# Patient Record
Sex: Female | Born: 1960 | Race: White | Hispanic: No | Marital: Single
Health system: Midwestern US, Community
[De-identification: ages and names within clinical notes are randomized; demographics above are authoritative.]

## PROBLEM LIST (undated history)

## (undated) DIAGNOSIS — I10 Essential (primary) hypertension: Secondary | ICD-10-CM

---

## 2015-07-05 ENCOUNTER — Emergency Department: Admit: 2015-07-05 | Payer: PRIVATE HEALTH INSURANCE

## 2015-07-05 ENCOUNTER — Inpatient Hospital Stay
Admit: 2015-07-05 | Discharge: 2015-07-05 | Disposition: A | Discharge status: Home / Self Care | Payer: PRIVATE HEALTH INSURANCE

## 2015-07-05 DIAGNOSIS — J069 Acute upper respiratory infection, unspecified: Secondary | ICD-10-CM

## 2015-07-05 LAB — BASIC METABOLIC PANEL
Anion Gap: 9 mmol/L (ref 3–16)
BUN: 8 mg/dL (ref 7–25)
CO2: 24 mmol/L (ref 21–33)
Calcium: 9 mg/dL (ref 8.6–10.3)
Chloride: 106 mmol/L (ref 98–110)
Creatinine: 0.68 mg/dL (ref 0.60–1.30)
Glucose: 93 mg/dL (ref 70–100)
Osmolality, Calculated: 286 mOsm/kg (ref 278–305)
Potassium: 3.8 mmol/L (ref 3.5–5.3)
Sodium: 139 mmol/L (ref 133–146)
eGFR AA CKD-EPI: >90 See note.
eGFR NONAA CKD-EPI: >90 See note.

## 2015-07-05 LAB — DIFFERENTIAL
Basophils Absolute: 34 /uL (ref 0–200)
Basophils Relative: 0.7 % (ref 0.0–1.0)
Eosinophils Absolute: 115 /uL (ref 15–500)
Eosinophils Relative: 2.4 % (ref 0.0–8.0)
Lymphocytes Absolute: 581 /uL (ref 850–3900)
Lymphocytes Relative: 12.1 % (ref 15.0–45.0)
Monocytes Absolute: 461 /uL (ref 200–950)
Monocytes Relative: 9.6 % (ref 0.0–12.0)
Neutrophils Absolute: 3610 /uL (ref 1500–7800)
Neutrophils Relative: 75.2 % (ref 40.0–80.0)
nRBC: 0 /100 WBC (ref 0–0)

## 2015-07-05 LAB — CBC
Hematocrit: 40.4 % (ref 35.0–45.0)
Hemoglobin: 13.5 g/dL (ref 11.7–15.5)
MCH: 31.3 pg (ref 27.0–33.0)
MCHC: 33.5 g/dL (ref 32.0–36.0)
MCV: 93.5 fL (ref 80.0–100.0)
MPV: 9.1 fL (ref 7.5–11.5)
Platelets: 156 10*3/uL (ref 140–400)
RBC: 4.32 10*6/uL (ref 3.80–5.10)
RDW: 12.9 % (ref 11.0–15.0)
WBC: 4.8 10*3/uL (ref 3.8–10.8)

## 2015-07-05 LAB — TROPONIN I: Troponin I: <0.04 ng/mL (ref 0.00–0.03)

## 2015-07-05 LAB — B NATRIURETIC PEPTIDE: BNP: 31 pg/mL (ref 0–100)

## 2015-07-05 MED ORDER — lactatedRingers1000mLIVfluid
Freq: Once | INTRAVENOUS | Status: AC
Start: 2015-07-05 — End: 2015-07-05
  Administered 2015-07-05: 17:00:00 via INTRAVENOUS

## 2015-07-05 NOTE — Unmapped (Signed)
Upper Respiratory Infection, Adult  Most upper respiratory infections (URIs) are a viral infection of the air passages leading to the lungs. A URI affects the nose, throat, and upper air passages. The most common type of URI is nasopharyngitis and is typically referred to as the common cold.  URIs run their course and usually go away on their own. Most of the time, a URI does not require medical attention, but sometimes a bacterial infection in the upper airways can follow a viral infection. This is called a secondary infection. Sinus and middle ear infections are common types of secondary upper respiratory infections.  Bacterial pneumonia can also complicate a URI. A URI can worsen asthma and chronic obstructive pulmonary disease (COPD). Sometimes, these complications can require emergency medical care and may be life threatening.   CAUSES  Almost all URIs are caused by viruses. A virus is a type of germ and can spread from one person to another.   RISKS FACTORS  You may be at risk for a URI if:   ?? You smoke. ??  ?? You have chronic heart or lung disease.  ?? You have a weakened defense (immune) system. ??  ?? You are very young or very old. ??  ?? You have nasal allergies or asthma.  ?? You work in crowded or poorly ventilated areas.  ?? You work in health care facilities or schools.  SIGNS AND SYMPTOMS   Symptoms typically develop 2-3 days after you come in contact with a cold virus. Most viral URIs last 7-10 days. However, viral URIs from the influenza virus (flu virus) can last 14-18 days and are typically more severe. Symptoms may include:   ?? Runny or stuffy (congested) nose. ??  ?? Sneezing. ??  ?? Cough. ??  ?? Sore throat. ??  ?? Headache. ??  ?? Fatigue. ??  ?? Fever. ??  ?? Loss of appetite. ??  ?? Pain in your forehead, behind your eyes, and over your cheekbones (sinus pain).  ?? Muscle aches. ??  DIAGNOSIS   Your health care provider may diagnose a URI by:  ?? Physical exam.  ?? Tests to check that your symptoms are not due to  another condition such as:  ?? Strep throat.  ?? Sinusitis.  ?? Pneumonia.  ?? Asthma.  TREATMENT   A URI goes away on its own with time. It cannot be cured with medicines, but medicines may be prescribed or recommended to relieve symptoms. Medicines may help:  ?? Reduce your fever.  ?? Reduce your cough.  ?? Relieve nasal congestion.  HOME CARE INSTRUCTIONS   ?? Take medicines only as directed by your health care provider. ??  ?? Gargle warm saltwater or take cough drops to comfort your throat as directed by your health care provider.  ?? Use a warm mist humidifier or inhale steam from a shower to increase air moisture. This may make it easier to breathe.  ?? Drink enough fluid to keep your urine clear or pale yellow. ??  ?? Eat soups and other clear broths and maintain good nutrition. ??  ?? Rest as needed. ??  ?? Return to work when your temperature has returned to normal or as your health care provider advises. You may need to stay home longer to avoid infecting others. You can also use a face mask and careful hand washing to prevent spread of the virus.  ?? Increase the usage of your inhaler if you have asthma. ??  ?? Do not   use any tobacco products, including cigarettes, chewing tobacco, or electronic cigarettes. If you need help quitting, ask your health care provider.  PREVENTION   The best way to protect yourself from getting a cold is to practice good hygiene.   ?? Avoid oral or hand contact with people with cold symptoms. ??  ?? Wash your hands often if contact occurs. ??  There is no clear evidence that vitamin C, vitamin E, echinacea, or exercise reduces the chance of developing a cold. However, it is always recommended to get plenty of rest, exercise, and practice good nutrition.   SEEK MEDICAL CARE IF:   ?? You are getting worse rather than better. ??  ?? Your symptoms are not controlled by medicine. ??  ?? You have chills.  ?? You have worsening shortness of breath.  ?? You have brown or red mucus.  ?? You have yellow or brown nasal  discharge.  ?? You have pain in your face, especially when you bend forward.  ?? You have a fever.  ?? You have swollen neck glands.  ?? You have pain while swallowing.  ?? You have white areas in the back of your throat.  SEEK IMMEDIATE MEDICAL CARE IF:   ?? You have severe or persistent:  ?? Headache.  ?? Ear pain.  ?? Sinus pain.  ?? Chest pain.  ?? You have chronic lung disease and any of the following:  ?? Wheezing.  ?? Prolonged cough.  ?? Coughing up blood.  ?? A change in your usual mucus.  ?? You have a stiff neck.  ?? You have changes in your:  ?? Vision.  ?? Hearing.  ?? Thinking.  ?? Mood.  MAKE SURE YOU:   ?? Understand these instructions.  ?? Will watch your condition.  ?? Will get help right away if you are not doing well or get worse.     This information is not intended to replace advice given to you by your health care provider. Make sure you discuss any questions you have with your health care provider.     Document Released: 07/19/2000 Document Revised: 06/09/2014 Document Reviewed: 04/30/2013  Elsevier Interactive Patient Education ??2016 [employer]

## 2015-07-05 NOTE — Unmapped (Signed)
Pt reports that she has been traveling on a [employer] bus for 3 days and decided to get off the bus and come to the ED d/t not feeling good. Pt states that she has been congested for a week and yesterday started with chest pain and mid back pain. Pt is alert and oriented x4.

## 2015-07-05 NOTE — Unmapped (Signed)
ED Attending Attestation Note    Date of service:  07/05/2015    This patient was seen by the resident physician.  I have seen and examined the patient, agree with the workup, evaluation, management and diagnosis. The care plan has been discussed and I concur.  I have reviewed the ECG and concur with the resident's interpretation.    My assessment reveals a 56 y.o. female presetns to the ED with cough, chills, myalgias, congestion and sore throat since yesterady.  Exam reveals clear lungs, no LE swelling, soft abodmne.

## 2015-07-05 NOTE — Unmapped (Signed)
Pt with multiple complaints, endorses cold and flu symptoms in the past week, then states at 0200 this am she started having CP and back pain.

## 2015-07-05 NOTE — Unmapped (Signed)
[facility]  Emergency Department Note    Date of Service: 07/05/2015    Reason for Visit: Dizziness; Chest Pain; and Nasal Congestion      Patient History     HPI:  55 y.o. female with presents with complaint of sore throat, rhinorrhea, chest pain, dizziness. Symptoms started yesterday. At that time, she developed itchy throat, runny nose, runny eyes. Early this morning she had 4 episodes of emesis, non-bloody, non-bilious. Endorses diaphoresis but no documented fever. Endorses mild chest pain, and a sharp pain in her back, between her shoulder blades. The pain in her back is 8.75/10, sharp, better when she lies down on her right side, worse when she walks fast. Endorses lightheadedness when she walks fast. Denies dysuria. Denies history of blood clots. Has been riding [employer] buses for 1.5 - 2 days. No recent surgery. Denies oral contraceptive. Denies smoking. Denies EtOH, illicit drugs.     Past medical history: No past medical history on file.    Past surgical history: No past surgical history on file.    Social history: [facility] Miller  has no tobacco, alcohol, and drug history on file.    Medications:   Patient's Medications    No medications on file       Allergies:   Allergies as of 07/05/2015   ??? (Not on File)       PMH: Nursing notes reviewed   PSH: Nursing notes reviewed   FH: Nursing notes reviewed   MEDS: Nursing notes and chart reviewed     Review of Systems     ROS:  A full 10-point review of systems was performed. Positive for rhinorrhea, sore throat. Negative for fever. Otherwise negative except as detailed in the above HPI.    Physical Exam     Filed Vitals:    07/05/15 1229   BP: 118/70   Pulse: 94   Temp: 99.7 ??F (37.6 ??C)   TempSrc: Oral   Resp: 16   SpO2: 96%       General:  55 y.o. female, appears ill but non-toxic    HEENT:  normocephalic, atraumatic; pupils equal, round and reactive; sclera anicteric; conjunctiva pink; moist mucous membranes; no oropharyngeal erythema or mucosal lesions      Neck:  supple; no meningismus     Pulmonary:   lungs clear to auscultation bilaterally with good air entry; dry cough     Cardiac:  regular rate and regular rhythm with no murmurs, rubs, or gallops     Abdomen:  soft; non-tender, non-distended; no rebound; no guarding; normal bowel sounds     Musculoskeletal:  atraumatic exam with no focal swelling or tenderness, no peripheral edema     Vascular:  2+ radial pulses     Skin:  warm and well perfused; no rashes or lesions     Neuro:  alert and oriented x 4; cranial nerves II - XII grossly intact; normal gait; strength grossly intact; sensation grossly intact     Psych:  appropriate mood and affect     Diagnostic Studies     Labs:  Please see the EMR for comprehensive lab results.   All labs have been reviewed.     Radiology:  Please see the EMR for comprehensive imaging results.   All imaging reviewed.     EKG:  Ventricular rate: 86 bpm  Rhythm: normal sinus   QRS Axis: +38   Intervals: PR 116 QRS 72 QTc 397   ST Segments: no acute change  T Waves: small inversion V1  Q Waves: none  Clinical Impression: normal sinus rhythm, small T wave inversion V1, no prior for comparison    Emergency Department Procedures     - no procedures performed during this encounter     ED Course and MDM     Gloria Miller is a 55 y.o. female with a history and presentation as described in the above HPI.  The patient was evaluated by myself and the ED Attending Physician, Dr. Rubye Oaks. All management and disposition plans were discussed and agreed upon.    This Is a 55 year old female who presents with one day of upper respiratory infection like symptoms.  On arrival, the patient is noted to be afebrile and otherwise unremarkable vital signs.  She appears ill but nontoxic.  She is actively coughing.  Her cardiac exam reveals no murmur.  Lungs are clear to auscultation bilaterally.  Abdominal exam is benign.  The patient's EKG shows one small T wave inversion in V1, but is otherwise  unremarkable.  Believe the patient's complaint of chest discomfort is less likely to represent ACS, as she has a reassuring EKG and a negative troponin.  I believe her chest symptoms are related to her coughing.  Similarly, I believe that the pain the patient is experiencing her back is related to coughing.  No midline tenderness to palpation.    Renal panel is generally unremarkable.  CBC is without leukocytosis.  Troponin is within normal limits, as is BNP.  Chest X-ray is negative.    The patient's presentation is most consistent with acute upper respiratory infection. Given pt is able to eat and drink without difficulty, has vital signs that are reassuring, as well as an exam and laboratory studies that are reassuring, I believe she is appropriate for management on an outpatient basis.    Risks, benefits, and alternatives were discussed. At this time the patient has been deemed safe for discharge. My customary discharge instructions including strict return precautions for worsening or new symptoms have been communicated.      Impression     1. Upper respiratory infection, acute         ---    Patriciaann Clan, MD PGY-2  [facility]    Patriciaann Clan, MD  Resident  07/05/15 [phone]

## 2016-02-07 ENCOUNTER — Emergency Department (HOSPITAL_COMMUNITY): Payer: Self-pay | Admitting: Emergency Medicine

## 2016-04-08 ENCOUNTER — Emergency Department
Admission: EM | Admit: 2016-04-08 | Discharge: 2016-04-08 | Disposition: A | Discharge status: Home / Self Care | Payer: Medicaid Other | Attending: Emergency Medicine | Admitting: Emergency Medicine

## 2016-04-08 ENCOUNTER — Emergency Department (HOSPITAL_COMMUNITY)

## 2016-04-08 ENCOUNTER — Emergency Department (HOSPITAL_COMMUNITY): Payer: Medicaid Other | Admitting: Radiology

## 2016-04-08 DIAGNOSIS — R001 Bradycardia, unspecified: Secondary | ICD-10-CM

## 2016-04-08 DIAGNOSIS — J069 Acute upper respiratory infection, unspecified: Secondary | ICD-10-CM | POA: Insufficient documentation

## 2016-04-08 LAB — ECG 12 LEAD
Atrial Rate: 58 {beats}/min
Calculated P Axis: 12 degrees
Calculated R Axis: 10 degrees
Calculated R Axis: 10 degrees
Calculated T Axis: 23 degrees
PR Interval: 138 ms
QRS Duration: 76 ms
QT Interval: 386 ms
QTC Calculation: 378 ms
Ventricular rate: 58 {beats}/min

## 2016-04-08 LAB — RAPID INFLUENZA A/B ANTIGEN
INFLUENZA A ANTIGEN: NEGATIVE
INFLUENZA B ANTIGEN: NEGATIVE

## 2016-04-08 NOTE — Discharge Instructions (Signed)
Return to emergency department with any pain, fever, bleeding, visual changes, weakness, numbness, tingling, or any concerns whatsoever.

## 2016-04-08 NOTE — ED Triage Notes (Signed)
Arrived this morning from [place], [facility] brought to the [facility] and while in their lobby she started getting dizzy.  States " I don't have any place to stay".

## 2016-04-08 NOTE — ED Nurses Note (Signed)
Ambulated to bathroom and back, tolerated well, gait steady.  No distress.

## 2016-04-08 NOTE — ED Provider Notes (Signed)
Emergency Department  Provider Note  HPI - 04/08/2016    Name: Lynn Fox  Age and Gender: 56 y.o. female  Attending: Dr. Lorita Officer      HPI:  Lynn Fox is a 56 y.o. female  who presents to the ED today for right ear pain. Patient reports right ear pain onset 04/06/2016. Associated sxs of productive cough, rhinorrhea, nausea, subjective fever, sore throat, dizziness. States that the dizziness is similar to the sensation you have when you finish a roller coaster ride. Reports dizziness exacerbated with ambulation and deep inhalation. States she put whiskey on a Q-tip to clean her right ear. Denies all other sxs and complaints at this time. Reports right foot fx diagnosed 03/30/2016 and occasionally take IBU to relieve the pain. No pertinent PMHx or PSHx. Denies smoking, alcohol consumption, and drug use. States that she has been homeless for four months.  States she has not had a menstrual period for 22 years. PCP is in [place].     Location: right ear  Quality: pain  Onset: 04/06/2016  Severity: Moderate  Timing: constant  Context: homeless  Modifying factors: tried to put whiskey on a q-tip to clean her right ear which did not alleviate sxs  Associated symptoms: cough, rhinorrhea, subjective fever, sore throat, dizziness, nausea    History provided by:     Review of Systems:    Constitutional: +fever No chills  Skin: No rashes or lesions  HENT: +right ear pain +sore throat +rhinorrhea No difficulty swallowing  Eyes: No vision changes, redness, discharge  Cardio: No chest pain, palpitations   Respiratory: +cough No wheezing or SOB  GI: +nausea  No vomiting. No diarrhea or constipation. No abdominal pain  GU:  No dysuria, hematuria, polyuria  MSK: No joint pain.  No neck or back pain  Neuro: +dizzness No numbness, tingling, or weakness.  No headache  All other systems reviewed and are negative, unless commented on in the HPI.       Below information reviewed with patient:   Current Outpatient  Prescriptions   Medication Sig    IBUPROFEN ORAL Take by mouth       No Known Allergies       Past Medical History:  No past medical history on file.    Past Surgical History:  No past surgical history on file.    Social History:  Social History   Substance Use Topics    Smoking status: Not on file    Smokeless tobacco: Not on file    Alcohol use Not on file     History   Drug Use Not on file       Family History:  No family history on file.      Old records reviewed.      Objective:  Nursing notes reviewed    Filed Vitals:    04/08/16 1032   BP: 113/80   Pulse: 59   Resp: 12   Temp: 36.4 C (97.6 F)   SpO2: 100%       Physical Exam  Nursing note and vitals reviewed.  Vital signs reviewed as above.     Constitutional: Pt is well-developed and well-nourished.   Head: Normocephalic and atraumatic.   Eyes: Conjunctivae are normal. Pupils are equal, round, and reactive to light. EOM are intact  Neck: Soft, supple, full range of motion.  Cardiovascular: RRR.  No Murmurs/rubs/gallops.   Pulmonary/Chest: Normal BS BL with no distress. No audible wheezes or crackles are noted.  Abdominal: Soft, nontender, nondistended.  No rebound, guarding, or masses.  Musculoskeletal: Normal range of motion. No deformities.  Exhibits no edema and no tenderness.   Neurological: CNs 2-12 grossly intact.  No focal deficits noted.  Skin: Warm and dry. No rash or lesions      Work-up:  Orders Placed This Encounter    RAPID INFLUENZA A/B ANTIGEN    MOBILE CHEST X-RAY    ECG 12 LEAD        Labs:  Results for orders placed or performed during the hospital encounter of 04/08/16 (from the past 24 hour(s))   RAPID INFLUENZA A/B ANTIGEN   Result Value Ref Range    INFLUENZA A ANTIGEN Negative Negative    INFLUENZA B ANTIGEN Negative Negative       Imaging:    Results for orders placed or performed during the hospital encounter of 04/08/16 (from the past 72 hour(s))   MOBILE CHEST X-RAY     Status: None    Narrative    Chest AP portable  upright at 11:01 AM.  HISTORY: Cough.     The lungs are well expanded and appear clear.  The heart size is  acceptable considering the magnification by the AP technique. No  significant vascular congestion or effusion is seen      Impression    1.   No active disease detected.         Abnormal Lab results:  Labs Reviewed   RAPID INFLUENZA A/B ANTIGEN - Normal       ECG: I have reviewed the ECG and my interpretation is as follows:   ECG performed on 04/08/2016 at 11:11:50  Vent Rate 58 bpm  Sinus bradycardia  NO STEMI  Reviewed by Dr. Lorita Officer     Plan: Appropriate labs and imaging ordered. Medical Records reviewed.    MDM:   During the patient's stay in the emergency department, the above listed imaging and/or labs were performed to assist with medical decision making and were reviewed by myself when available for review.     Pt remained stable throughout the emergency department course.      ED Course   Lorita Officer S's Documentation   Comment Time   1200 -- patient was rechecked, she was able to ambulate without assistance, she is tolerating oral crackers and Sprite.  She is neurologically intact, she denies any dizziness currently and she says that her only real complaint is that she has had a sore throat, right ear pain, and a cough.  Her chest x-ray as well as influenza screen and EKG are all unremarkable so we will discharge her in stable condition. 03/03 1217         Impression:   Encounter Diagnosis   Name Primary?    Upper respiratory tract infection, unspecified type Yes     Disposition:  Discharged      It was advised that the patient return to the ED with any new, concerning or worsening symptoms and follow up as directed.    The patient verbalized understanding of all instructions and had no further questions or concerns.     Follow up:   [facility]  [address]  [address]  [place] [address]  [phone]    In 1 week          I am scribing for, and in the  presence of, Dr. Lorita Officer for services provided on 04/08/2016.  Jeralene Peters, SCRIBE  Jeralene PetersCassandra Carl, SCRIBE  04/08/2016, 11:12    I personally performed the services described in this documentation, as scribed  in my presence, and it is both accurate  and complete.    Concha NorwayNoah S Jaedyn Marrufo, MD  Concha NorwayNoah S Mercer Peifer, MD  04/13/2016, 07:26

## 2016-04-26 ENCOUNTER — Emergency Department (HOSPITAL_COMMUNITY): Payer: Medicaid Other | Admitting: Radiology

## 2016-04-26 ENCOUNTER — Emergency Department
Admission: EM | Admit: 2016-04-26 | Discharge: 2016-04-27 | Disposition: A | Discharge status: Home / Self Care | Payer: Medicaid Other | Attending: Emergency Medicine | Admitting: Emergency Medicine

## 2016-04-26 ENCOUNTER — Encounter (HOSPITAL_COMMUNITY): Payer: Self-pay

## 2016-04-26 DIAGNOSIS — R079 Chest pain, unspecified: Secondary | ICD-10-CM

## 2016-04-26 DIAGNOSIS — J069 Acute upper respiratory infection, unspecified: Principal | ICD-10-CM | POA: Insufficient documentation

## 2016-04-26 LAB — CBC WITH DIFF
BASOPHIL #: 0 x10ˆ3/uL (ref 0.00–1.00)
BASOPHIL %: 1 % (ref 0–1)
EOSINOPHIL #: 0.1 x10ˆ3/uL (ref 0.00–0.40)
EOSINOPHIL %: 2 % (ref 1–5)
HCT: 41.8 % (ref 37.0–47.0)
HGB: 14.1 g/dL (ref 12.0–16.0)
LYMPHOCYTE #: 1.5 x10ˆ3/uL (ref 1.00–4.00)
LYMPHOCYTE %: 39 % (ref 25–40)
MCH: 31.6 pg — ABNORMAL HIGH (ref 27.0–31.0)
MCHC: 33.7 g/dL (ref 33.0–37.0)
MCV: 93.7 fL (ref 80.0–99.0)
MONOCYTE #: 0.6 x10ˆ3/uL (ref 0.10–0.80)
MONOCYTE %: 14 % — ABNORMAL HIGH (ref 4–10)
NEUTROPHIL #: 1.7 x10ˆ3/uL — ABNORMAL LOW (ref 1.80–7.00)
NEUTROPHIL %: 43 % — ABNORMAL LOW (ref 50–73)
PLATELETS: 173 10*3/uL (ref 130–400)
PLATELETS: 173 x10ˆ3/uL (ref 130–400)
RBC: 4.46 x10ˆ6/uL (ref 4.20–5.40)
RDW: 13.3 % (ref 11.5–14.5)
WBC: 3.9 x10ˆ3/uL — ABNORMAL LOW (ref 4.8–10.8)

## 2016-04-26 LAB — HEPATIC FUNCTION PANEL
ALKALINE PHOSPHATASE: 74 U/L (ref 38–126)
BILIRUBIN TOTAL: 0.7 mg/dL (ref 0.2–1.3)

## 2016-04-26 LAB — ECG 12 LEAD
Atrial Rate: 74 {beats}/min
Calculated P Axis: 24 degrees
Calculated R Axis: 32 degrees
Calculated T Axis: 36 degrees
PR Interval: 126 ms
QRS Duration: 82 ms
QRS Duration: 82 ms
QT Interval: 366 ms
QTC Calculation: 406 ms
Ventricular rate: 74 {beats}/min

## 2016-04-26 LAB — RAPID INFLUENZA A/B ANTIGEN: INFLUENZA B ANTIGEN: NEGATIVE

## 2016-04-26 LAB — BASIC METABOLIC PANEL
ANION GAP: 11 mmol/L
BUN/CREA RATIO: 20
BUN/CREA RATIO: 20
BUN: 14 mg/dL (ref 7–18)
CALCIUM: 9.6 mg/dL (ref 8.5–10.3)
CHLORIDE: 109 mmol/L (ref 101–111)
CO2 TOTAL: 23 mmol/L (ref 22–31)
CREATININE: 0.7 mg/dL (ref 0.50–1.20)
CREATININE: 0.7 mg/dL (ref 0.50–1.20)
ESTIMATED GFR: 87 mL/min/1.73m?2 (ref >=60)
GLUCOSE: 91 mg/dL (ref 68–99)
POTASSIUM: 3.7 mmol/L (ref 3.6–5.0)
POTASSIUM: 3.7 mmol/L (ref 3.6–5.0)
SODIUM: 143 mmol/L (ref 137–145)

## 2016-04-26 LAB — TROPONIN-I: TROPONIN I: <0.01 ng/mL (ref 0.00–0.03)

## 2016-04-26 NOTE — ED Provider Notes (Signed)
Emergency Department  Provider Note  HPI - 04/26/2016    Name: Lynn Fox  Age and Gender: 56 y.o. female  Attending: Dr. Georgena Spurling      HPI:  Lynn Fox is a 56 y.o. female  who presents to the ED today for chest pain that radiates into her back x4 days. Patient reports that the pain is sharp. States that she also has cough, sneezing and sore throat. [facility] and thinks she may have caught a cold their. Denies diarrhea, black/bloody stool or any other associated symptoms at this time.       History provided by: patient    Review of Systems:    Constitutional: No fever, chills  Skin: No rashes or lesions  HENT: +sore throat, +sneezing  Eyes: No vision changes, redness, discharge  Cardio: +chest pain  Respiratory: +cough  GI:  No nausea or vomiting. No diarrhea or constipation. No abdominal pain  GU:  No dysuria, hematuria, polyuria  MSK: No joint pain. +back pain  Neuro: No numbness, tingling, or weakness.  No headache  All other systems reviewed and are negative, unless commented on in the HPI.       Below information reviewed with patient:   Current Outpatient Prescriptions   Medication Sig   . IBUPROFEN ORAL Take by mouth       No Known Allergies       Past Medical History:  History reviewed. No pertinent past medical history.    Past Surgical History:  History reviewed. No pertinent surgical history.    Social History:  Social History   Substance Use Topics   . Smoking status: Never Smoker   . Smokeless tobacco: Never Used   . Alcohol use No     History   Drug Use No       Family History:  No family history on file.      Old records reviewed.      Objective:  Nursing notes reviewed    Filed Vitals:    04/26/16 2157   BP: 114/69   Pulse: 75   Resp: 18   Temp: 36.4 C (97.5 F)   SpO2: 100%       Physical Exam  Nursing note and vitals reviewed.  Vital signs reviewed as above.     Constitutional: Pt is well-developed and well-nourished.   Head:  Normocephalic and atraumatic.   Eyes: Conjunctivae are normal. Pupils are equal, round, and reactive to light. EOM are intact  Neck: Soft, supple, full range of motion.  Cardiovascular: RRR.  No Murmurs/rubs/gallops. Distal pulses present and equal bilaterally.  Pulmonary/Chest: Normal BS BL with no distress. No audible wheezes or crackles are noted.  Abdominal: Soft, nontender, nondistended.  No rebound, guarding, or masses.  Musculoskeletal: Normal range of motion. No deformities.  Exhibits no edema and no tenderness.   Neurological: CNs 2-12 grossly intact.  No focal deficits noted.  Skin: Warm and dry. No rash or lesions  Psychiatric: Patient has a normal mood and affect.     Work-up:  Orders Placed This Encounter   . RAPID INFLUENZA A/B ANTIGEN   . XR CHEST PA AND LATERAL   . TROPONIN-I (SERIAL TROPONINS)   . CBC/DIFF   . BASIC METABOLIC PANEL   . HEPATIC FUNCTION PANEL   . CBC WITH DIFF   . ECG 12 LEAD- TIME WITH TROPONINS        Labs:  Results for orders placed  or performed during the hospital encounter of 04/26/16 (from the past 24 hour(s))   RAPID INFLUENZA A/B ANTIGEN   Result Value Ref Range    INFLUENZA A ANTIGEN Negative Negative    INFLUENZA B ANTIGEN Negative Negative   TROPONIN-I (SERIAL TROPONINS)   Result Value Ref Range    TROPONIN I <0.01 0.00 - 0.03 ng/mL   CBC/DIFF    Narrative    The following orders were created for panel order CBC/DIFF.  Procedure                               Abnormality         Status                     ---------                               -----------         ------                     CBC WITH DIFF[[id number]]                Abnormal            Final result                 Please view results for these tests on the individual orders.   BASIC METABOLIC PANEL   Result Value Ref Range    SODIUM 143 137 - 145 mmol/L    POTASSIUM 3.7 3.6 - 5.0 mmol/L    CHLORIDE 109 101 - 111 mmol/L    CO2 TOTAL 23 22 - 31 mmol/L    ANION GAP 11 mmol/L    CALCIUM 9.6 8.5 - 10.3 mg/dL     GLUCOSE 91 68 - 99 mg/dL    BUN 14 7 - 18 mg/dL    CREATININE 1.610.70 0.960.50 - 1.20 mg/dL    BUN/CREA RATIO 20     ESTIMATED GFR 87 >=60 mL/min/1.3476m^2   HEPATIC FUNCTION PANEL   Result Value Ref Range    ALBUMIN 3.8 3.6 - 4.8 g/dL    ALKALINE PHOSPHATASE 74 38 - 126 U/L    ALT (SGPT) 28 3 - 45 U/L    AST (SGOT) 29 7 - 56 U/L    BILIRUBIN TOTAL 0.7 0.2 - 1.3 mg/dL    BILIRUBIN DIRECT 0.0 0.0 - 0.4 mg/dL    PROTEIN TOTAL 6.9 6.2 - 8.0 g/dL   CBC WITH DIFF   Result Value Ref Range    WBC 3.9 (L) 4.8 - 10.8 x10^3/uL    RBC 4.46 4.20 - 5.40 x10^6/uL    HGB 14.1 12.0 - 16.0 g/dL    HCT 04.541.8 40.937.0 - 81.147.0 %    MCV 93.7 80.0 - 99.0 fL    MCH 31.6 (H) 27.0 - 31.0 pg    MCHC 33.7 33.0 - 37.0 g/dL    RDW 91.413.3 78.211.5 - 95.614.5 %    PLATELETS 173 130 - 400 x10^3/uL    NEUTROPHIL % 43 (L) 50 - 73 %    LYMPHOCYTE % 39 25 - 40 %    MONOCYTE % 14 (H) 4 - 10 %    EOSINOPHIL % 2 1 - 5 %    BASOPHIL % 1 0 - 1 %    NEUTROPHIL #  1.70 (L) 1.80 - 7.00 x10^3/uL    LYMPHOCYTE # 1.50 1.00 - 4.00 x10^3/uL    MONOCYTE # 0.60 0.10 - 0.80 x10^3/uL    EOSINOPHIL # 0.10 0.00 - 0.40 x10^3/uL    BASOPHIL # 0.00 0.00 - 1.00 x10^3/uL       Imaging:    Results for orders placed or performed during the hospital encounter of 04/26/16 (from the past 72 hour(s))   XR CHEST PA AND LATERAL     Status: None    Narrative    Female, 56 years old.    XR CHEST PA AND LATERAL performed on 04/26/2016 11:38 PM.    REASON FOR EXAM:  Cough. Chest congestion. Fever. Body aches. Cold chills.    TECHNIQUE: 2 views/2 images submitted for interpretation.    COMPARISON:  04/08/2016    FINDINGS:  The cardiac silhouette and pulmonary vascularity are within  normal limits.    The lungs are expanded and no focal consolidative process is detected in  the lungs. No pleural effusion.    There is old fracture deformity and/or cervical rib noted in the upper  right hemithorax and this finding is unchanged.      Impression    No acute cardiopulmonary process is seen.         Abnormal Lab results:   Labs Reviewed   CBC WITH DIFF - Abnormal; Notable for the following:        Result Value    WBC 3.9 (*)     MCH 31.6 (*)     NEUTROPHIL % 43 (*)     MONOCYTE % 14 (*)     NEUTROPHIL # 1.70 (*)     All other components within normal limits   RAPID INFLUENZA A/B ANTIGEN - Normal   TROPONIN-I - Normal   HEPATIC FUNCTION PANEL - Normal   CBC/DIFF    Narrative:     The following orders were created for panel order CBC/DIFF.  Procedure                               Abnormality         Status                     ---------                               -----------         ------                     CBC WITH DIFF[[id number]]                Abnormal            Final result                 Please view results for these tests on the individual orders.   BASIC METABOLIC PANEL   TROPONIN-I   TROPONIN-I       ECG performed on 04/26/2016 at 21:47:34: I have reviewed the ECG and my interpretation is as follows: NSr, rate 74, NO-STEMI     Plan: Appropriate labs and imaging ordered. Medical Records reviewed.    MDM:   During the patient's stay in the emergency department, the above listed imaging and/or labs were performed to assist with medical decision making and were  reviewed by myself when available for review.     Pt remained stable throughout the emergency department course.        Impression:   Encounter Diagnosis   Name Primary?   Marland Kitchen Upper respiratory tract infection, unspecified type Yes     Disposition:  Discharged       It was advised that the patient return to the ED with any new, concerning or worsening symptoms and follow up as directed.    The patient verbalized understanding of all instructions and had no further questions or concerns.     Follow up:   [facility]  [address]  [place] [address]  [phone]      As needed      I am scribing for, and in the presence of, Dr. Georgena Spurling for services provided on 04/26/2016.  Rachael Hescht, SCRIBE     Rachael Hescht, SCRIBE   04/26/2016, 22:00      I personally performed the services described in this documentation, as scribed  in my presence, and it is both accurate  and complete.    Georgena Spurling, DO

## 2016-04-26 NOTE — ED Triage Notes (Signed)
Pt presents with c/c of upper abdominal pain and lower chest pain with nausea and vomiting.

## 2016-04-27 LAB — TROPONIN-I: TROPONIN I: <0.01 ng/mL (ref 0.00–0.03)

## 2016-04-27 NOTE — ED Nurses Note (Signed)
Pt resting in bed. No acute signs of pain or distress noted.

## 2016-07-06 ENCOUNTER — Emergency Department: Admit: 2016-07-07 | Payer: MEDICAID

## 2016-07-06 DIAGNOSIS — M25561 Pain in right knee: Secondary | ICD-10-CM

## 2016-07-06 NOTE — ED Provider Notes (Signed)
HPI Comments: Pt presents to the dept for c/o right knee pain after falling 3 weeks ago. Pt is ambulatory.     Patient is a 56 y.o. female presenting with knee pain. The history is provided by the patient. No language interpreter was used.   Knee Pain    This is a new problem. The current episode started more than 1 week ago. The problem occurs rarely. The problem has not changed since onset.The pain is present in the right knee. The pain is at a severity of 4/10. The pain is mild. Pertinent negatives include no numbness, full range of motion, no stiffness and no tingling. The symptoms are aggravated by palpation. She has tried nothing for the symptoms. The treatment provided no relief.        History reviewed. No pertinent past medical history.    Past Surgical History:   Procedure Laterality Date   ??? HX SALPINGO-OOPHORECTOMY           History reviewed. No pertinent family history.    Social History     Social History   ??? Marital status: SINGLE     Spouse name: N/A   ??? Number of children: N/A   ??? Years of education: N/A     Occupational History   ??? Not on file.     Social History Main Topics   ??? Smoking status: Never Smoker   ??? Smokeless tobacco: Never Used   ??? Alcohol use No   ??? Drug use: No   ??? Sexual activity: Not on file     Other Topics Concern   ??? Not on file     Social History Narrative   ??? No narrative on file         ALLERGIES: Lortab [hydrocodone-acetaminophen]; Aleve [naproxen sodium]; and Anacin [aspirin-caffeine]    Review of Systems   Constitutional: Negative for activity change.   HENT: Negative.    Respiratory: Negative.    Gastrointestinal: Negative.    Genitourinary: Negative.    Musculoskeletal: Positive for arthralgias. Negative for joint swelling and stiffness.        Right knee   Skin: Negative.  Negative for wound.   Neurological: Negative.  Negative for tingling and numbness.   Psychiatric/Behavioral: Negative.        Vitals:    07/06/16 2239   BP: 100/69   Pulse: 76   Resp: 16    Temp: 97.8 ??F (36.6 ??C)   SpO2: 99%   Weight: 55.8 kg (123 lb)   Height: 5\' 2"  (1.575 m)            Physical Exam   Constitutional: She is oriented to person, place, and time. She appears well-developed and well-nourished.   HENT:   Head: Normocephalic.   Cardiovascular: Normal rate, regular rhythm, normal heart sounds and intact distal pulses.    Pulmonary/Chest: Effort normal and breath sounds normal.   Abdominal: Soft. Bowel sounds are normal.   Musculoskeletal: Normal range of motion. She exhibits tenderness. She exhibits no edema or deformity.        Right knee: She exhibits abnormal meniscus. She exhibits normal range of motion, no swelling, no effusion, no deformity and no erythema. Tenderness found. Lateral joint line tenderness noted.   Right meniscal pain with palpation   Neurological: She is alert and oriented to person, place, and time.   Skin: Skin is warm and dry.   Psychiatric: She has a normal mood and affect. Her behavior is normal. Judgment  and thought content normal.   Nursing note and vitals reviewed.       MDM  Number of Diagnoses or Management Options  Knee meniscus pain, right: new and requires workup     Amount and/or Complexity of Data Reviewed  Tests in the radiology section of CPT??: reviewed and ordered  Discussion of test results with the performing providers: yes  Discuss the patient with other providers: yes  Independent visualization of images, tracings, or specimens: yes    Risk of Complications, Morbidity, and/or Mortality  Presenting problems: low  Diagnostic procedures: minimal  Management options: low          ED Course   Will review labs and/or xray and discuss the case with the attending physician    Discussed with the patient and all questioned fully answered. She will call me if any problems arise.      Procedures

## 2016-07-06 NOTE — ED Triage Notes (Signed)
Patient here with complaint of right knee pain after falling 3 weeks ago.

## 2016-07-07 ENCOUNTER — Inpatient Hospital Stay
Admit: 2016-07-07 | Discharge: 2016-07-07 | Disposition: A | Discharge status: Home / Self Care | Payer: MEDICAID | Attending: Emergency Medicine

## 2016-07-07 MED ORDER — PREDNISONE 20 MG TAB
20 mg | ORAL_TABLET | Freq: Every day | ORAL | 0 refills | Status: AC
Start: 2016-07-07 — End: 2016-07-11

## 2016-07-07 NOTE — ED Notes (Signed)
Pt given written discharge instructions and questions answered. Pt ambulates from ed with out difficulty

## 2016-08-09 ENCOUNTER — Inpatient Hospital Stay
Admit: 2016-08-09 | Discharge: 2016-08-09 | Disposition: A | Discharge status: Home / Self Care | Payer: MEDICAID | Attending: Personal Emergency Response Attendant

## 2016-08-09 ENCOUNTER — Emergency Department: Admit: 2016-08-09 | Payer: MEDICAID

## 2016-08-09 DIAGNOSIS — T675XXA Heat exhaustion, unspecified, initial encounter: Secondary | ICD-10-CM

## 2016-08-09 LAB — CBC WITH AUTOMATED DIFF
ABS. BASOPHILS: 0 10*3/uL (ref 0.0–0.1)
ABS. EOSINOPHILS: 0.2 10*3/uL (ref 0.0–0.5)
ABS. IMM. GRANS.: 0 10*3/uL
ABS. LYMPHOCYTES: 1.3 10*3/uL (ref 0.8–3.5)
ABS. MONOCYTES: 0.4 10*3/uL — ABNORMAL LOW (ref 0.8–3.5)
ABS. NEUTROPHILS: 2.1 10*3/uL (ref 1.5–8.0)
BASOPHILS: 1 % (ref 0–2)
EOSINOPHILS: 4 % (ref 0–5)
HCT: 40.2 % — ABNORMAL LOW (ref 41–53)
HGB: 13.3 g/dL (ref 12.0–16.0)
IMMATURE GRANULOCYTES: 0 % — ABNORMAL LOW (ref 2–10)
LYMPHOCYTES: 32 % (ref 19–48)
MCH: 31 PG (ref 27–31)
MCHC: 33.1 g/dL (ref 31–37)
MCV: 93.7 FL (ref 80–100)
MONOCYTES: 11 % — ABNORMAL HIGH (ref 3–9)
MPV: 10.9 FL — ABNORMAL HIGH (ref 5.9–10.3)
NEUTROPHILS: 52 % (ref 40–74)
PLATELET: 176 10*3/uL (ref 130–400)
RBC: 4.29 M/uL (ref 4.2–5.4)
RDW: 12 % (ref 11.5–14.5)
WBC: 4 10*3/uL — ABNORMAL LOW (ref 4.5–10.8)

## 2016-08-09 LAB — METABOLIC PANEL, BASIC
Anion gap: 6 mmol/L (ref 6–15)
BUN/Creatinine ratio: 16 (ref 7–25)
BUN: 15 MG/DL (ref 7–18)
CO2: 28 mmol/L (ref 21–32)
Calcium: 8.3 MG/DL — ABNORMAL LOW (ref 8.5–10.1)
Chloride: 111 mmol/L — ABNORMAL HIGH (ref 98–107)
Creatinine: 0.95 MG/DL (ref 0.60–1.30)
GFR est AA: >60 mL/min/{1.73_m2} (ref >60)
GFR est non-AA: >60 mL/min/{1.73_m2} (ref >60)
Glucose: 85 mg/dL (ref 70–110)
Potassium: 4.3 mmol/L (ref 3.5–5.3)
Sodium: 145 mmol/L (ref 136–145)

## 2016-08-09 LAB — URINALYSIS W/ RFLX MICROSCOPIC
Bilirubin: NEGATIVE
Blood: NEGATIVE
Glucose: 250 mg/dL — AB
Ketone: NEGATIVE mg/dL
Leukocyte Esterase: NEGATIVE
Nitrites: NEGATIVE
Protein: NEGATIVE mg/dL
Specific gravity: 1.025 (ref 1.002–1.030)
Urobilinogen: 1 EU/dL (ref 0–1)
pH (UA): 8 (ref 4.5–8.0)

## 2016-08-09 LAB — URINE MICROSCOPIC

## 2016-08-09 LAB — TROPONIN I: Troponin-I, Qt.: <0.02 ng/mL (ref 0.00–0.05)

## 2016-08-09 MED ORDER — SODIUM CHLORIDE 0.9% BOLUS IV
0.9 % | Freq: Once | INTRAVENOUS | Status: AC
Start: 2016-08-09 — End: 2016-08-09
  Administered 2016-08-09: 20:00:00 via INTRAVENOUS

## 2016-08-09 MED FILL — SODIUM CHLORIDE 0.9 % IV: INTRAVENOUS | Qty: 1000

## 2016-08-09 NOTE — ED Notes (Signed)
Pt resting with eyes closed. Arouses easily to voice. Denies pain. Requesting drink. To ask provider. Awaiting review of results and plan of care.

## 2016-08-09 NOTE — ED Triage Notes (Signed)
Pt arrived per EMS to ER 9 with c/o dizziness and nausea  after walking about 3 hrs Zofran 4 mg IV and 300cc NS gven enroute

## 2016-08-09 NOTE — ED Notes (Signed)
AVS and follow up reviewed with pt. She verbalized understanding and declined help leaving department.

## 2016-08-09 NOTE — ED Provider Notes (Signed)
HPI Comments: Pt states that she became dizzy approx 1.5 hrs ago. She admits to being outside all day the past 2 days picking up pop cans for extra money. Denies chest pain, n/v     Patient is a 56 y.o. female presenting with dizziness and nausea. The history is provided by the patient. No language interpreter was used.   Dizziness   This is a new problem. The current episode started 1 to 2 hours ago. The problem has not changed since onset.There was no focality noted. Pertinent negatives include no focal weakness, no loss of balance, no slurred speech, no speech difficulty, no movement disorder, no mental status change and no disorientation. There has been no fever. Associated symptoms include nausea. Pertinent negatives include no shortness of breath, no chest pain, no vomiting, no altered mental status, no confusion, no bowel incontinence and no bladder incontinence.   Nausea    Pertinent negatives include no fever.        History reviewed. No pertinent past medical history.    Past Surgical History:   Procedure Laterality Date   ??? HX SALPINGO-OOPHORECTOMY           History reviewed. No pertinent family history.    Social History     Social History   ??? Marital status: SINGLE     Spouse name: N/A   ??? Number of children: N/A   ??? Years of education: N/A     Occupational History   ??? Not on file.     Social History Main Topics   ??? Smoking status: Never Smoker   ??? Smokeless tobacco: Never Used   ??? Alcohol use No   ??? Drug use: No   ??? Sexual activity: Not on file     Other Topics Concern   ??? Not on file     Social History Narrative         ALLERGIES: Lortab [hydrocodone-acetaminophen]; Aleve [naproxen sodium]; and Anacin [aspirin-caffeine]    Review of Systems   Constitutional: Negative for activity change, appetite change and fever.   Eyes: Negative for visual disturbance.   Respiratory: Negative for shortness of breath and wheezing.    Cardiovascular: Negative for chest pain and leg swelling.    Gastrointestinal: Positive for nausea. Negative for bowel incontinence and vomiting.   Genitourinary: Negative.  Negative for bladder incontinence.   Musculoskeletal: Negative.    Skin: Negative.    Neurological: Positive for dizziness and light-headedness. Negative for focal weakness, syncope, speech difficulty and loss of balance.   Psychiatric/Behavioral: Negative.  Negative for confusion.       Vitals:    08/09/16 1459   BP: 96/72   Pulse: 78   Resp: 16   Temp: 98.9 ??F (37.2 ??C)   SpO2: 97%   Weight: 53.5 kg (118 lb)   Height: 5\' 2"  (1.575 m)            Physical Exam   Constitutional: She is oriented to person, place, and time. She appears well-developed and well-nourished. She is cooperative.  Non-toxic appearance. She does not appear ill.   HENT:   Head: Normocephalic.   Cardiovascular: Normal rate, regular rhythm, normal heart sounds and intact distal pulses.    Pulmonary/Chest: Effort normal and breath sounds normal. She has no decreased breath sounds. She has no wheezes.   Abdominal: Soft. Bowel sounds are normal. There is no tenderness.   Musculoskeletal: She exhibits no edema or deformity.   Neurological: She is alert and oriented  to person, place, and time. She has normal strength. No cranial nerve deficit or sensory deficit. GCS eye subscore is 4. GCS verbal subscore is 5. GCS motor subscore is 6.   Skin: Skin is warm and dry.   Psychiatric: She has a normal mood and affect. Her behavior is normal. Judgment and thought content normal.   Nursing note and vitals reviewed.       MDM  Number of Diagnoses or Management Options  Dehydration: new and requires workup  Heat exhaustion, initial encounter: new and requires workup     Amount and/or Complexity of Data Reviewed  Clinical lab tests: ordered and reviewed  Tests in the radiology section of CPT??: ordered and reviewed  Discussion of test results with the performing providers: yes  Discuss the patient with other providers: yes   Independent visualization of images, tracings, or specimens: yes    Risk of Complications, Morbidity, and/or Mortality  Presenting problems: high  Diagnostic procedures: moderate  Management options: moderate          ED Course   Will review labs and/or xray and discuss the case with the attending physician    Discussed test results with the attending and the pt. Plan of care discussed with the pt and they agree to the plan of care without questions or concerns at this time.     Procedures

## 2016-08-09 NOTE — ED Notes (Signed)
Lab called for blood work.

## 2016-08-09 NOTE — ED Notes (Signed)
BP= 106/64 now

## 2016-08-09 NOTE — ED Notes (Signed)
Pt assisted to toilet. Pt very unsteady on feet. Pt returned safely to bed. Rails up. Continue to monitor. Fluids infusing.

## 2016-08-09 NOTE — ED Notes (Signed)
Pt to Xray with Williams, EMT

## 2016-08-10 LAB — EKG 12-LEAD
Atrial Rate: 77 {beats}/min
Diagnosis: NORMAL
P Axis: 38 degrees
P-R Interval: 128 ms
Q-T Interval: 356 ms
QRS Duration: 80 ms
QTc Calculation (Bazett): 402 ms
R Axis: 43 degrees
T Axis: 43 degrees
Ventricular Rate: 77 {beats}/min

## 2016-08-10 LAB — EKG, 12 LEAD, INITIAL
Atrial Rate: 77 {beats}/min
Calculated P Axis: 38 degrees
Calculated R Axis: 43 degrees
Calculated T Axis: 43 degrees
Diagnosis: NORMAL
P-R Interval: 128 ms
Q-T Interval: 356 ms
QRS Duration: 80 ms
QTC Calculation (Bezet): 402 ms
Ventricular Rate: 77 {beats}/min

## 2016-09-15 ENCOUNTER — Emergency Department (HOSPITAL_COMMUNITY): Payer: Medicaid - Out of State

## 2016-09-15 ENCOUNTER — Encounter (HOSPITAL_COMMUNITY): Payer: Self-pay

## 2016-09-15 ENCOUNTER — Emergency Department (HOSPITAL_COMMUNITY)
Admission: EM | Admit: 2016-09-15 | Discharge: 2016-09-16 | Disposition: A | Discharge status: Home / Self Care | Payer: Medicaid - Out of State | Attending: Emergency Medicine | Admitting: Emergency Medicine

## 2016-09-15 DIAGNOSIS — I639 Cerebral infarction, unspecified: Secondary | ICD-10-CM | POA: Insufficient documentation

## 2016-09-15 DIAGNOSIS — R112 Nausea with vomiting, unspecified: Secondary | ICD-10-CM | POA: Diagnosis not present

## 2016-09-15 DIAGNOSIS — Z87891 Personal history of nicotine dependence: Secondary | ICD-10-CM | POA: Insufficient documentation

## 2016-09-15 DIAGNOSIS — I10 Essential (primary) hypertension: Secondary | ICD-10-CM | POA: Insufficient documentation

## 2016-09-15 DIAGNOSIS — G43109 Migraine with aura, not intractable, without status migrainosus: Secondary | ICD-10-CM

## 2016-09-15 DIAGNOSIS — R42 Dizziness and giddiness: Secondary | ICD-10-CM | POA: Diagnosis present

## 2016-09-15 DIAGNOSIS — H539 Unspecified visual disturbance: Secondary | ICD-10-CM | POA: Diagnosis not present

## 2016-09-15 HISTORY — DX: Essential (primary) hypertension: I10

## 2016-09-15 LAB — COMPREHENSIVE METABOLIC PANEL
ALBUMIN: 3.5 g/dL (ref 3.5–5.0)
ALK PHOS: 57 U/L (ref 38–126)
ALT: 25 U/L (ref 14–54)
ANION GAP: 11 (ref 5–15)
AST: 30 U/L (ref 15–41)
BILIRUBIN TOTAL: 1.1 mg/dL (ref 0.3–1.2)
BUN: 13 mg/dL (ref 6–20)
CALCIUM: 8.9 mg/dL (ref 8.9–10.3)
CO2: 21 mmol/L — ABNORMAL LOW (ref 22–32)
Chloride: 110 mmol/L (ref 101–111)
Creatinine, Ser: 0.87 mg/dL (ref 0.44–1.00)
GLUCOSE: 120 mg/dL — AB (ref 65–99)
Potassium: 3.9 mmol/L (ref 3.5–5.1)
Sodium: 142 mmol/L (ref 135–145)
TOTAL PROTEIN: 6.5 g/dL (ref 6.5–8.1)

## 2016-09-15 LAB — URINALYSIS, ROUTINE W REFLEX MICROSCOPIC
BILIRUBIN URINE: NEGATIVE
Bacteria, UA: NONE SEEN
GLUCOSE, UA: 50 mg/dL — AB
Hgb urine dipstick: NEGATIVE
KETONES UR: NEGATIVE mg/dL
NITRITE: NEGATIVE
PH: 6 (ref 5.0–8.0)
Protein, ur: 30 mg/dL — AB
SPECIFIC GRAVITY, URINE: 1.03 (ref 1.005–1.030)

## 2016-09-15 LAB — RAPID URINE DRUG SCREEN, HOSP PERFORMED
AMPHETAMINES: NOT DETECTED
BARBITURATES: NOT DETECTED
BENZODIAZEPINES: NOT DETECTED
Cocaine: NOT DETECTED
Opiates: NOT DETECTED
TETRAHYDROCANNABINOL: NOT DETECTED

## 2016-09-15 LAB — I-STAT CHEM 8, ED
BUN: 14 mg/dL (ref 6–20)
CALCIUM ION: 1.02 mmol/L — AB (ref 1.15–1.40)
CREATININE: 0.8 mg/dL (ref 0.44–1.00)
Chloride: 110 mmol/L (ref 101–111)
Glucose, Bld: 116 mg/dL — ABNORMAL HIGH (ref 65–99)
HEMATOCRIT: 38 % (ref 36.0–46.0)
HEMOGLOBIN: 12.9 g/dL (ref 12.0–15.0)
Potassium: 3.9 mmol/L (ref 3.5–5.1)
Sodium: 144 mmol/L (ref 135–145)
TCO2: 24 mmol/L (ref 0–100)

## 2016-09-15 LAB — CBC
HEMATOCRIT: 41.3 % (ref 36.0–46.0)
HEMOGLOBIN: 13.3 g/dL (ref 12.0–15.0)
MCH: 30.8 pg (ref 26.0–34.0)
MCHC: 32.2 g/dL (ref 30.0–36.0)
MCV: 95.6 fL (ref 78.0–100.0)
Platelets: 187 10*3/uL (ref 150–400)
RBC: 4.32 MIL/uL (ref 3.87–5.11)
RDW: 13.2 % (ref 11.5–15.5)
WBC: 3.6 10*3/uL — ABNORMAL LOW (ref 4.0–10.5)

## 2016-09-15 LAB — DIFFERENTIAL
Basophils Absolute: 0 10*3/uL (ref 0.0–0.1)
Basophils Relative: 1 %
EOS ABS: 0.1 10*3/uL (ref 0.0–0.7)
EOS PCT: 3 %
LYMPHS ABS: 1 10*3/uL (ref 0.7–4.0)
LYMPHS PCT: 28 %
MONO ABS: 0.6 10*3/uL (ref 0.1–1.0)
MONOS PCT: 16 %
Neutro Abs: 1.9 10*3/uL (ref 1.7–7.7)
Neutrophils Relative %: 52 %

## 2016-09-15 LAB — PROTIME-INR
INR: 1.03
Prothrombin Time: 13.5 seconds (ref 11.4–15.2)

## 2016-09-15 LAB — ETHANOL: Alcohol, Ethyl (B): <5 mg/dL (ref <5)

## 2016-09-15 LAB — APTT: aPTT: 23 seconds — ABNORMAL LOW (ref 24–36)

## 2016-09-15 LAB — CBG MONITORING, ED: Glucose-Capillary: 111 mg/dL — ABNORMAL HIGH (ref 65–99)

## 2016-09-15 LAB — I-STAT TROPONIN, ED: Troponin i, poc: 0 ng/mL (ref 0.00–0.08)

## 2016-09-15 MED ORDER — DIPHENHYDRAMINE HCL 50 MG/ML IJ SOLN
12.5000 mg | Freq: Once | INTRAMUSCULAR | Status: AC
  Administered 2016-09-15: 12.5 mg via INTRAVENOUS
  Filled 2016-09-15: qty 1

## 2016-09-15 MED ORDER — SODIUM CHLORIDE 0.9 % IV BOLUS (SEPSIS)
500.0000 mL | Freq: Once | INTRAVENOUS | Status: AC
  Administered 2016-09-15: 500 mL via INTRAVENOUS

## 2016-09-15 MED ORDER — PROCHLORPERAZINE EDISYLATE 5 MG/ML IJ SOLN
10.0000 mg | Freq: Once | INTRAMUSCULAR | Status: AC
  Administered 2016-09-15: 10 mg via INTRAVENOUS
  Filled 2016-09-15: qty 2

## 2016-09-15 NOTE — ED Notes (Signed)
[facility]  Patient denies any pain at this time.  Patient to be ambulated and if stable can be discharged.

## 2016-09-15 NOTE — Code Documentation (Signed)
LKW:  1pm:  Patient became sick about 1pm nausea and vomiting.  This afternoon about 5:30pm she felt light headed and saw spots and "baby worms" and had a HA.  She still states she has some blurred vision.  Per EMS some slurred speech.  Arrived via EMS at 1804  Stat head CT and labs done.  NIHSS 1, left leg drift, other symptoms have resolved, still states has a head ache.  Dr Amada JupiterKirkpatrick at bedside to assess patient.  Plan MRI

## 2016-09-15 NOTE — ED Notes (Signed)
Pt ambulated in hall, Pt has steady gait.

## 2016-09-15 NOTE — ED Notes (Signed)
Patient taken to MRI

## 2016-09-15 NOTE — ED Provider Notes (Signed)
[facility]-EMERGENCY DEPT Provider Note   CSN: [id number] Arrival date & time: 09/15/16  [id number]   An emergency department physician performed an initial assessment on this suspected stroke patient at 1806.  History   Chief Complaint Chief Complaint  Patient presents with  . Code Stroke    LSN 1300    HPI Rhonda Case is a 56 y.o. female.  The history is provided by the patient. No language interpreter was used.    Rhonda Case is a 56 y.o. female who presents to the Emergency Department complaining of code stroke.  She presents via EMS as a code stroke. She was feeling well until about 30 minutes before 911 was called. She developed left-sided weakness and numbness as well as a feeling like worms were in her vision and she fell to the ground. She describes associated nausea. On ED arrival she is developing a headache. No prior history of similar symptoms. She takes no medications.  Past Medical History:  Diagnosis Date  . Hypertension     There are no active problems to display for this patient.   No past surgical history on file.  OB History    No data available       Home Medications    Prior to Admission medications   Not on File    Family History No family history on file.  Social History Social History  Substance Use Topics  . Smoking status: Former Games developermoker  . Smokeless tobacco: Not on file  . Alcohol use Yes     Allergies   Patient has no known allergies.   Review of Systems Review of Systems  All other systems reviewed and are negative.    Physical Exam Updated Vital Signs BP 99/61   Pulse 72   Temp 98.6 F (37 C) (Oral)   Resp 14   Wt 71.3 kg (157 lb 3 oz)   LMP  (LMP Unknown)   SpO2 95%   Physical Exam  Constitutional: She is oriented to person, place, and time. She appears well-developed and well-nourished.  HENT:  Head: Normocephalic and atraumatic.  Cardiovascular: Normal rate and regular rhythm.   No murmur  heard. Pulmonary/Chest: Effort normal and breath sounds normal. No respiratory distress.  Abdominal: Soft. There is no tenderness. There is no rebound and no guarding.  Musculoskeletal: She exhibits no edema or tenderness.  Neurological: She is alert and oriented to person, place, and time.  5 out of 5 strength in all 4 extremities with sensation to light touch intact in all 4 extremities  Skin: Skin is warm and dry.  Psychiatric: She has a normal mood and affect. Her behavior is normal.  Nursing note and vitals reviewed.    ED Treatments / Results  Labs (all labs ordered are listed, but only abnormal results are displayed) Labs Reviewed  APTT - Abnormal; Notable for the following:       Result Value   aPTT 23 (*)    All other components within normal limits  CBC - Abnormal; Notable for the following:    WBC 3.6 (*)    All other components within normal limits  COMPREHENSIVE METABOLIC PANEL - Abnormal; Notable for the following:    CO2 21 (*)    Glucose, Bld 120 (*)    All other components within normal limits  CBG MONITORING, ED - Abnormal; Notable for the following:    Glucose-Capillary 111 (*)    All other components within normal limits  I-STAT CHEM 8, ED -  Abnormal; Notable for the following:    Glucose, Bld 116 (*)    Calcium, Ion 1.02 (*)    All other components within normal limits  PROTIME-INR  DIFFERENTIAL  ETHANOL  RAPID URINE DRUG SCREEN, HOSP PERFORMED  URINALYSIS, ROUTINE W REFLEX MICROSCOPIC  I-STAT TROPONIN, ED    EKG  EKG Interpretation None       Radiology Ct Head Code Stroke Wo Contrast  Result Date: 09/15/2016 CLINICAL DATA:  Code stroke. Initial evaluation for acute slurred speech. EXAM: CT HEAD WITHOUT CONTRAST TECHNIQUE: Contiguous axial images were obtained from the base of the skull through the vertex without intravenous contrast. COMPARISON:  None. FINDINGS: Brain: Cerebral volume normal. No acute intracranial hemorrhage. No evidence for  acute large vessel territory infarct. No mass lesion, midline shift or mass effect. No hydrocephalus. No extra-axial fluid collection. Vascular: No asymmetric hyperdense vessel. Scattered vascular calcifications noted within the carotid siphons. Skull: Scalp soft tissues and calvarium within normal limits. Sinuses/Orbits: Globes normal soft tissues normal. Paranasal sinuses and mastoids are clear. ASPECTS [facility] Stroke Program Early CT Score) - Ganglionic level infarction (caudate, lentiform nuclei, internal capsule, insula, M1-M3 cortex): 7 - Supraganglionic infarction (M4-M6 cortex): 3 Total score (0-10 with 10 being normal): 10 IMPRESSION: 1. No acute intracranial infarct or other process identified. 2. ASPECTS is 10. Critical Value/emergent results were called by telephone at the time of interpretation on 09/15/2016 at 6:40 pm to Dr. Ritta Slot , who verbally acknowledged these results. Electronically Signed   By: Rise Mu M.D.   On: 09/15/2016 18:41    Procedures Procedures (including critical care time)  Medications Ordered in ED Medications - No data to display   Initial Impression / Assessment and Plan / ED Course  I have reviewed the triage vital signs and the nursing notes.  Pertinent labs & imaging results that were available during my care of the patient were reviewed by me and considered in my medical decision making (see chart for details).     Patient presented to the emergency department as a code stroke with near syncopal sensation followed by sided numbness and weakness as well as headache. She was initially evaluated by neurology, followed by myself. On my assessment she had no focal neurologic deficits with no numbness or weakness. She does complain of headache history with a headache cocktail. Her headache did improve after treatment. Presentation is not consistent with subarachnoid hemorrhage, meningitis, CVA, rapidly expanding aneurysm. In terms of near  syncopal sensation, feel this is likely related to her headache.  Presentation is not consistent with significant arrhythmia, PE. Counseled pt on home care for headache, outpatient follow-up and return precautions.  Final Clinical Impressions(s) / ED Diagnoses   Final diagnoses:  None    New Prescriptions New Prescriptions   No medications on file     Tilden Fossa, MD 09/16/16 0040

## 2016-09-15 NOTE — Consult Note (Signed)
Requesting Physician: Dr. Madilyn Hook    Chief Complaint: Slurred speech  History obtained from:  Patient  HPI:                                                                                                                                      Rhonda Case is an 56 y.o. female without a significant past medical history presents to [facility] complaining of slurred speech, headache and visual changes. Ms. Landeck stated that around 1300 she experienced nausea and vomiting that was associated with light headedness and a visual disturbance (black spots and black "worm tails") across her entire visual field. She then had onset of a left temporal headache. She stated that she lost consciousness and fell onto a chair during this time. Shortly following, she felt as though she had slurred speech and called EMS, who activated a code stroke. She was brought to [facility] for further evaluation and treatment.  On arrival, she stated that she still had a temporal headache but that her nausea had improved and the spots were no longer in her visual fields. During preliminary testing, she found that her left leg was heavier than her right leg and she noted paresthesias in the left foot.   Pertinent Imaging: CT head  Date last known well: Date: 09/15/2016 Time last known well: Time: 13:00  tPA Given: No. NIHSS - 1  Modified Rankin Score: 0  Stroke Risk Factors - none   Past medical history: None, occasionally takes ibuprofen  Family history: No history of strokes or migraine  SHx: denies Tobacco    Medications:                                                                                                                       No outpatient prescriptions have been marked as taking for the 09/15/16 encounter [facility] [place] Encounter).    Review Of Systems:  History obtained from the patient  General:  Negative for fever Psychological: Negative for any known behavioral disorder Ophthalmic: As above Respiratory: Negative for cough Cardiovascular: Negative for irregular heartbeat Gastrointestinal: As above Musculoskeletal: Positive for muscular weakness - as above Neurological: As noted in HPI Dermatological: Positive for tinging in left foot - as above  Weight 71.3 kg (157 lb 3 oz).   Physical Examination:                                                                                                      General: WDWN female.  HEENT:  Normocephalic, no lesions, without obvious abnormality.  Normal external eye and conjunctiva.  Normal external ears. Normal external nose, mucus membranes and septum.  Normal pharynx. Cardiovascular: regular rate and rhythm Pulmonary: Unlabored breathing Abdomen: Soft Extremities: no joint deformities, effusion, or inflammation Musculoskeletal:Tone and bulk:normal tone throughout; no atrophy noted Skin: warm and dry, no hyperpigmentation, vitiligo, or suspicious lesions  Neurological Examination:                                                                                               Mental Status: Rhonda Case is alert, oriented, thought content appropriate.  Speech fluent without evidence of aphasia. Able to follow 3-step commands without difficulty. Cranial Nerves: II: Visual fields grossly normal, pupils are equal, round, reactive to light. III,IV, VI: Ptosis not present, extra-ocular muscle movements intact bilaterally V,VII: Smile and eyebrow raise is symmetric. Facial light touch and pinprick sensation intact bilaterally VIII: Hearing grossly intact IX,X: Uvula and palate rise symmetrically XI: SCM and bilateral shoulder shrug strength 5/5 XII: Midline tongue extension Motor: Right :     Upper extremity   5/5   Left:     Upper extremity   5/5          Lower extremity   5/5    she has difficulty with raising her left leg, and has to  use her "inner strength" to lift it, but once lifted she is able to hold out drift. Pronator drift not present Sensory: Light touch intact throughout, bilaterally Deep Tendon Reflexes: Brisk throughout  Plantars: Right: downgoing   Left: downgoing  Hoffman's sign negative Cerebellar: Finger-to-nose test without evidence of dysmetria or ataxia. Heel-to-shin test executed within normal limits. Gait: Deferred  Lab Results: Basic Metabolic Panel:  Recent Labs Lab 09/15/16 1818  NA 144  K 3.9  CL 110  GLUCOSE 116*  BUN 14  CREATININE 0.80    Liver Function Tests: No results for input(s): AST, ALT, ALKPHOS, BILITOT, PROT, ALBUMIN in the last 168 hours. No results for input(s): LIPASE, AMYLASE in the last 168 hours. No results for input(s): AMMONIA in  the last 168 hours.  CBC:  Recent Labs Lab 09/15/16 1810 09/15/16 1818  WBC 3.6*  --   NEUTROABS 1.9  --   HGB 13.3 12.9  HCT 41.3 38.0  MCV 95.6  --   PLT 187  --     Cardiac Enzymes: No results for input(s): CKTOTAL, CKMB, CKMBINDEX, TROPONINI in the last 168 hours.  Lipid Panel: No results for input(s): CHOL, TRIG, HDL, CHOLHDL, VLDL, LDLCALC in the last 168 hours.  CBG:  Recent Labs Lab 09/15/16 1808  GLUCAP 111*    Microbiology: No results found for this or any previous visit.  Coagulation Studies:  Recent Labs  09/15/16 1810  LABPROT 13.5  INR 1.03    Imaging: No results found.   Assessment and plan per attending neurologist.  Leonel Ramsay PA-C [facility] [phone]  09/15/2016, 6:36 PM  I have seen the patient reviewed the above note. She has mild left leg weakness, though slightly inconsistent and is able to hold it without drift once encouraged 2.   Assessment  56 year old female with visual change, spots in vision, possible syncope following nausea. I do wonder about syncope, but she now complains of persistent left leg weakness though there are some findings that may  be wonder if this is nonphysiological. Also with her photophobic headache and the description of her visual changes, I do wonder about, due to migraine that this could be unusual in the patient her age without a clear history of migraines.  I would favor MRI at this time.   Plan:  1) MRI brain 2) if negative, would consider workup for presyncope and could treat for complex acute migraine  Ritta Slot, MD [facility] [phone]  If 7pm- 7am, please page neurology on call as listed in AMION.

## 2016-09-20 ENCOUNTER — Encounter (HOSPITAL_COMMUNITY): Payer: Self-pay

## 2016-09-20 ENCOUNTER — Emergency Department (HOSPITAL_COMMUNITY): Payer: Medicaid - Out of State

## 2016-09-20 ENCOUNTER — Emergency Department (HOSPITAL_COMMUNITY)
Admission: EM | Admit: 2016-09-20 | Discharge: 2016-09-20 | Disposition: A | Discharge status: Home / Self Care | Payer: Medicaid - Out of State | Attending: Emergency Medicine | Admitting: Emergency Medicine

## 2016-09-20 DIAGNOSIS — Y939 Activity, unspecified: Secondary | ICD-10-CM | POA: Diagnosis not present

## 2016-09-20 DIAGNOSIS — S93601A Unspecified sprain of right foot, initial encounter: Secondary | ICD-10-CM | POA: Diagnosis not present

## 2016-09-20 DIAGNOSIS — S99921A Unspecified injury of right foot, initial encounter: Secondary | ICD-10-CM | POA: Diagnosis present

## 2016-09-20 DIAGNOSIS — Y999 Unspecified external cause status: Secondary | ICD-10-CM | POA: Insufficient documentation

## 2016-09-20 DIAGNOSIS — S93401A Sprain of unspecified ligament of right ankle, initial encounter: Secondary | ICD-10-CM | POA: Diagnosis not present

## 2016-09-20 DIAGNOSIS — Y929 Unspecified place or not applicable: Secondary | ICD-10-CM | POA: Insufficient documentation

## 2016-09-20 DIAGNOSIS — X509XXA Other and unspecified overexertion or strenuous movements or postures, initial encounter: Secondary | ICD-10-CM | POA: Diagnosis not present

## 2016-09-20 DIAGNOSIS — I10 Essential (primary) hypertension: Secondary | ICD-10-CM | POA: Insufficient documentation

## 2016-09-20 DIAGNOSIS — Z87891 Personal history of nicotine dependence: Secondary | ICD-10-CM | POA: Insufficient documentation

## 2016-09-20 NOTE — ED Provider Notes (Signed)
[facility]-EMERGENCY DEPT Provider Note   CSN: [id number] Arrival date & time: 09/20/16  2151     History   Chief Complaint Chief Complaint  Patient presents with  . Foot Pain    HPI Rhonda Case is a 56 y.o. female.  Patient presents to the ED with a chief complaint of right foot pain.  She states that she stepped in a pothole 2-3 days ago.  She states that she has had persistent pain since the accident, but has still ben able to ambulate.  She reports some pain that radiates from the foot to the ankle.  She has not tried anything for her symptoms.  The symptoms are worsened with movement and palpation.  She denies any other associated symptoms.   The history is provided by the patient. No language interpreter was used.    Past Medical History:  Diagnosis Date  . Hypertension     There are no active problems to display for this patient.   History reviewed. No pertinent surgical history.  OB History    No data available       Home Medications    Prior to Admission medications   Medication Sig Start Date End Date Taking? Authorizing Provider  acetaminophen (TYLENOL) 500 MG tablet Take 500 mg by mouth every 6 (six) hours as needed (foot pain).    [provider]    Family History History reviewed. No pertinent family history.  Social History Social History  Substance Use Topics  . Smoking status: Former Games developermoker  . Smokeless tobacco: Never Used  . Alcohol use Yes     Allergies   Aleve [naproxen]; Codeine sulfate; Tylenol [acetaminophen]; and Lortab [hydrocodone-acetaminophen]   Review of Systems Review of Systems  All other systems reviewed and are negative.    Physical Exam Updated Vital Signs BP 122/79 (BP Location: Right Arm)   Pulse 65   Temp 98 F (36.7 C) (Oral)   Resp 18   Ht 5\' 2"  (1.575 m)   Wt 65.8 kg (145 lb)   LMP  (LMP Unknown)   SpO2 98%   BMI 26.52 kg/m   Physical Exam Nursing note and vitals  reviewed.  Constitutional: Pt appears well-developed and well-nourished. No distress.  HENT:  Head: Normocephalic and atraumatic.  Eyes: Conjunctivae are normal.  Neck: Normal range of motion.  Cardiovascular: Normal rate, regular rhythm. Intact distal pulses.   Capillary refill < 3 sec.  Pulmonary/Chest: Effort normal and breath sounds normal.  Musculoskeletal:  RLE Pt exhibits TTP to the right foot anteriorly, no bony abnormality or deformity.   ROM: 5/5  Strength: 5/5  Neurological: Pt  is alert. Coordination normal.  Sensation: 5/5 Skin: Skin is warm and dry. Pt is not diaphoretic.  No evidence of open wound or skin tenting Psychiatric: Pt has a normal mood and affect.     ED Treatments / Results  Labs (all labs ordered are listed, but only abnormal results are displayed) Labs Reviewed - No data to display  EKG  EKG Interpretation None       Radiology Dg Foot Complete Right  Result Date: 09/20/2016 CLINICAL DATA:  Right foot pain and swelling.  No injury. EXAM: RIGHT FOOT COMPLETE - 3+ VIEW COMPARISON:  None. FINDINGS: There is no evidence of fracture or dislocation. 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 Mild hammertoe deformity of the digits. No inflammatory arthropathy or bony destructive change. Mild dorsal soft tissue edema. No soft tissue air. IMPRESSION: 1. Soft tissue  edema. 2. No acute osseous abnormality.  Mild osteoarthritis. Electronically Signed   By: Rubye Oaks M.D.   On: 09/20/2016 23:22    Procedures Procedures (including critical care time)  Medications Ordered in ED Medications - No data to display   Initial Impression / Assessment and Plan / ED Course  I have reviewed the triage vital signs and the nursing notes.  Pertinent labs & imaging results that were available during my care of the patient were reviewed by me and considered in my medical decision making (see chart for details).     Patient X-Ray  negative for obvious fracture or dislocation.  Pt advised to follow up with orthopedics. Patient given splint and crutches while in ED, conservative therapy recommended and discussed. Patient will be discharged home & is agreeable with above plan. Returns precautions discussed. Pt appears safe for discharge.   Final Clinical Impressions(s) / ED Diagnoses   Final diagnoses:  Sprain of right foot, initial encounter  Sprain of right ankle, unspecified ligament, initial encounter    New Prescriptions New Prescriptions   No medications on file     Roxy Horseman, Cordelia Poche 09/20/16 2330    Derwood Kaplan, MD 09/21/16 [phone]

## 2016-09-20 NOTE — ED Triage Notes (Signed)
Pt complains of right foot pain, no current injury but has been injured in the past Her foot is red and swollen

## 2016-09-20 NOTE — ED Notes (Signed)
Bed: WTR6 Expected date:  Expected time:  Means of arrival:  Comments: 

## 2016-09-21 ENCOUNTER — Emergency Department (HOSPITAL_COMMUNITY): Payer: Medicaid - Out of State

## 2016-09-21 ENCOUNTER — Encounter (HOSPITAL_COMMUNITY): Payer: Self-pay | Admitting: Radiology

## 2016-09-21 ENCOUNTER — Emergency Department (HOSPITAL_COMMUNITY)
Admission: EM | Admit: 2016-09-21 | Discharge: 2016-09-21 | Disposition: A | Discharge status: Home / Self Care | Payer: Medicaid - Out of State | Attending: Emergency Medicine | Admitting: Emergency Medicine

## 2016-09-21 DIAGNOSIS — R51 Headache: Secondary | ICD-10-CM | POA: Insufficient documentation

## 2016-09-21 DIAGNOSIS — Z87891 Personal history of nicotine dependence: Secondary | ICD-10-CM | POA: Insufficient documentation

## 2016-09-21 DIAGNOSIS — R5383 Other fatigue: Secondary | ICD-10-CM | POA: Diagnosis not present

## 2016-09-21 DIAGNOSIS — I10 Essential (primary) hypertension: Secondary | ICD-10-CM | POA: Insufficient documentation

## 2016-09-21 LAB — CBC
HEMATOCRIT: 36.9 % (ref 36.0–46.0)
Hemoglobin: 12.4 g/dL (ref 12.0–15.0)
MCH: 31.5 pg (ref 26.0–34.0)
MCHC: 33.6 g/dL (ref 30.0–36.0)
MCV: 93.7 fL (ref 78.0–100.0)
PLATELETS: 162 10*3/uL (ref 150–400)
RBC: 3.94 MIL/uL (ref 3.87–5.11)
RDW: 12.8 % (ref 11.5–15.5)
WBC: 3.4 10*3/uL — AB (ref 4.0–10.5)

## 2016-09-21 LAB — COMPREHENSIVE METABOLIC PANEL
ALT: 19 U/L (ref 14–54)
AST: 20 U/L (ref 15–41)
Albumin: 3.4 g/dL — ABNORMAL LOW (ref 3.5–5.0)
Alkaline Phosphatase: 47 U/L (ref 38–126)
Anion gap: 8 (ref 5–15)
BILIRUBIN TOTAL: 0.7 mg/dL (ref 0.3–1.2)
BUN: 8 mg/dL (ref 6–20)
CO2: 26 mmol/L (ref 22–32)
CREATININE: 0.65 mg/dL (ref 0.44–1.00)
Calcium: 8.9 mg/dL (ref 8.9–10.3)
Chloride: 110 mmol/L (ref 101–111)
Glucose, Bld: 93 mg/dL (ref 65–99)
POTASSIUM: 3.2 mmol/L — AB (ref 3.5–5.1)
Sodium: 144 mmol/L (ref 135–145)
TOTAL PROTEIN: 6.3 g/dL — AB (ref 6.5–8.1)

## 2016-09-21 LAB — URINALYSIS, ROUTINE W REFLEX MICROSCOPIC
BILIRUBIN URINE: NEGATIVE
GLUCOSE, UA: 50 mg/dL — AB
HGB URINE DIPSTICK: NEGATIVE
KETONES UR: 5 mg/dL — AB
NITRITE: NEGATIVE
PH: 7 (ref 5.0–8.0)
Protein, ur: 30 mg/dL — AB
Specific Gravity, Urine: 1.026 (ref 1.005–1.030)

## 2016-09-21 LAB — RAPID URINE DRUG SCREEN, HOSP PERFORMED
Amphetamines: NOT DETECTED
BENZODIAZEPINES: NOT DETECTED
Barbiturates: NOT DETECTED
Cocaine: NOT DETECTED
OPIATES: NOT DETECTED
Tetrahydrocannabinol: NOT DETECTED

## 2016-09-21 LAB — ETHANOL

## 2016-09-21 MED ORDER — CEPHALEXIN 500 MG PO CAPS
500.0000 mg | ORAL_CAPSULE | Freq: Once | ORAL | Status: AC
  Administered 2016-09-21: 500 mg via ORAL
  Filled 2016-09-21: qty 1

## 2016-09-21 NOTE — ED Notes (Signed)
Bed: [id number] Expected date:  Expected time:  Means of arrival:  Comments: EMS- 56yo F, drowsy/lethargic

## 2016-09-21 NOTE — ED Triage Notes (Signed)
Per EMS, Patient seen yesterday for twisted ankle. Today, homeless shelter called ambulance to say she was drowsy and lethargic after patient took tylenol, which she is allergic to. AOx4. EMS reports: Unsteady gait. No hives or SOB.

## 2016-09-21 NOTE — ED Notes (Signed)
Upon going to D/C patient, patient was not in the room. Patient did not receive paperwork. Last seen A&Ox4 and ambulated without difficulty.

## 2016-09-21 NOTE — ED Provider Notes (Signed)
[facility]-EMERGENCY DEPT Provider Note   CSN: [id number] Arrival date & time: 09/21/16  1206     History   Chief Complaint Chief Complaint  Patient presents with  . Lethargic    HPI Rhonda Case is a 56 y.o. female.  HPI Patient presents today from the homeless shelter for possible altered mental status.  The patient feels fine and states she was sleepy.  Staff was concerned that she was more drowsy and lethargic than usual.  No new medications.  She does admit to taking Tylenol for ankle pain yesterday.  No recent anti-inflammatories.  Denies alcohol and drugs.  Reports mild right-sided headache at this time.  No recent fall or injury.  Denies neck pain.  No chest pain shortness breath.  Denies abdominal pain   Past Medical History:  Diagnosis Date  . Hypertension     There are no active problems to display for this patient.   No past surgical history on file.  OB History    No data available       Home Medications    Prior to Admission medications   Medication Sig Start Date End Date Taking? Authorizing Provider  acetaminophen (TYLENOL) 500 MG tablet Take 500 mg by mouth every 6 (six) hours as needed (foot pain).   Yes [provider]    Family History No family history on file.  Social History Social History  Substance Use Topics  . Smoking status: Former Games developer  . Smokeless tobacco: Never Used  . Alcohol use Yes     Allergies   Aleve [naproxen]; Codeine sulfate; Tylenol [acetaminophen]; and Lortab [hydrocodone-acetaminophen]   Review of Systems Review of Systems  All other systems reviewed and are negative.    Physical Exam Updated Vital Signs BP 111/87 (BP Location: Left Arm)   Pulse 64   Temp 98.1 F (36.7 C) (Oral)   Resp 16   LMP  (LMP Unknown)   SpO2 100%   Physical Exam  Constitutional: She is oriented to person, place, and time. She appears well-developed and well-nourished. No distress.  HENT:  Head: Normocephalic and  atraumatic.  Eyes: EOM are normal.  Neck: Normal range of motion.  Cardiovascular: Normal rate and regular rhythm.   Pulmonary/Chest: Effort normal and breath sounds normal.  Abdominal: Soft. She exhibits no distension. There is no tenderness.  Musculoskeletal: Normal range of motion.  Neurological: She is alert and oriented to person, place, and time.  Skin: Skin is warm and dry.  Psychiatric: She has a normal mood and affect. Judgment normal.  Nursing note and vitals reviewed.    ED Treatments / Results  Labs (all labs ordered are listed, but only abnormal results are displayed) Labs Reviewed - No data to display  EKG  EKG Interpretation None       Radiology Dg Foot Complete Right  Result Date: 09/20/2016 CLINICAL DATA:  Right foot pain and swelling.  No injury. EXAM: RIGHT FOOT COMPLETE - 3+ VIEW COMPARISON:  None. FINDINGS: There is no evidence of fracture or dislocation. 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 Mild hammertoe deformity of the digits. No inflammatory arthropathy or bony destructive change. Mild dorsal soft tissue edema. No soft tissue air. IMPRESSION: 1. Soft tissue edema. 2. No acute osseous abnormality.  Mild osteoarthritis. Electronically Signed   By: Rubye Oaks M.D.   On: 09/20/2016 23:22    Procedures Procedures (including critical care time)  Medications Ordered in ED Medications - No data to display  Initial Impression / Assessment and Plan / ED Course  I have reviewed the triage vital signs and the nursing notes.  Pertinent labs & imaging results that were available during my care of the patient were reviewed by me and considered in my medical decision making (see chart for details).     Patient monitored in the emergency department.  She is alert and oriented 3.  Workup in the ER is without significant abnormality.  Discharge home in good condition.  Final Clinical Impressions(s) / ED Diagnoses   Final  diagnoses:  None    New Prescriptions New Prescriptions   No medications on file     Azalia Bilisampos, Kayleana Waites, MD 09/21/16 [phone]

## 2016-09-23 LAB — URINE CULTURE

## 2016-10-06 ENCOUNTER — Emergency Department (HOSPITAL_COMMUNITY): Payer: Medicaid - Out of State

## 2016-10-06 ENCOUNTER — Emergency Department (HOSPITAL_COMMUNITY)
Admission: EM | Admit: 2016-10-06 | Discharge: 2016-10-06 | Disposition: A | Discharge status: Home / Self Care | Payer: Medicaid - Out of State | Attending: Emergency Medicine | Admitting: Emergency Medicine

## 2016-10-06 ENCOUNTER — Encounter (HOSPITAL_COMMUNITY): Payer: Self-pay | Admitting: Emergency Medicine

## 2016-10-06 DIAGNOSIS — Z87891 Personal history of nicotine dependence: Secondary | ICD-10-CM | POA: Diagnosis not present

## 2016-10-06 DIAGNOSIS — I10 Essential (primary) hypertension: Secondary | ICD-10-CM | POA: Insufficient documentation

## 2016-10-06 DIAGNOSIS — R55 Syncope and collapse: Secondary | ICD-10-CM | POA: Diagnosis not present

## 2016-10-06 DIAGNOSIS — R112 Nausea with vomiting, unspecified: Secondary | ICD-10-CM | POA: Diagnosis present

## 2016-10-06 LAB — COMPREHENSIVE METABOLIC PANEL
ALT: 17 U/L (ref 14–54)
AST: 27 U/L (ref 15–41)
Albumin: 3.8 g/dL (ref 3.5–5.0)
Alkaline Phosphatase: 52 U/L (ref 38–126)
Anion gap: 9 (ref 5–15)
BUN: 13 mg/dL (ref 6–20)
CO2: 23 mmol/L (ref 22–32)
Calcium: 8.9 mg/dL (ref 8.9–10.3)
Chloride: 109 mmol/L (ref 101–111)
Creatinine, Ser: 0.69 mg/dL (ref 0.44–1.00)
GFR calc Af Amer: >60 mL/min (ref >60)
GFR calc non Af Amer: >60 mL/min (ref >60)
Glucose, Bld: 120 mg/dL — ABNORMAL HIGH (ref 65–99)
Potassium: 3.9 mmol/L (ref 3.5–5.1)
Sodium: 141 mmol/L (ref 135–145)
Total Bilirubin: 1.3 mg/dL — ABNORMAL HIGH (ref 0.3–1.2)
Total Protein: 7.2 g/dL (ref 6.5–8.1)

## 2016-10-06 LAB — I-STAT BETA HCG BLOOD, ED (MC, WL, AP ONLY): I-stat hCG, quantitative: 6 m[IU]/mL — ABNORMAL HIGH (ref <5)

## 2016-10-06 LAB — CBC WITH DIFFERENTIAL/PLATELET
Basophils Absolute: 0 10*3/uL (ref 0.0–0.1)
Basophils Relative: 0 %
Eosinophils Absolute: 0 10*3/uL (ref 0.0–0.7)
Eosinophils Relative: 0 %
HCT: 40.7 % (ref 36.0–46.0)
Hemoglobin: 13.9 g/dL (ref 12.0–15.0)
Lymphocytes Relative: 10 %
Lymphs Abs: 0.6 10*3/uL — ABNORMAL LOW (ref 0.7–4.0)
MCH: 32.3 pg (ref 26.0–34.0)
MCHC: 34.2 g/dL (ref 30.0–36.0)
MCV: 94.4 fL (ref 78.0–100.0)
Monocytes Absolute: 0.1 10*3/uL (ref 0.1–1.0)
Monocytes Relative: 2 %
Neutro Abs: 5 10*3/uL (ref 1.7–7.7)
Neutrophils Relative %: 88 %
Platelets: 180 10*3/uL (ref 150–400)
RBC: 4.31 MIL/uL (ref 3.87–5.11)
RDW: 12.8 % (ref 11.5–15.5)
WBC: 5.8 10*3/uL (ref 4.0–10.5)

## 2016-10-06 LAB — URINALYSIS, ROUTINE W REFLEX MICROSCOPIC
Bacteria, UA: NONE SEEN
Bilirubin Urine: NEGATIVE
Glucose, UA: >=500 mg/dL — AB
Hgb urine dipstick: NEGATIVE
Ketones, ur: 80 mg/dL — AB
Nitrite: NEGATIVE
Protein, ur: NEGATIVE mg/dL
Specific Gravity, Urine: 1.021 (ref 1.005–1.030)
pH: 7 (ref 5.0–8.0)

## 2016-10-06 LAB — LIPASE, BLOOD: Lipase: 27 U/L (ref 11–51)

## 2016-10-06 LAB — HCG, QUANTITATIVE, PREGNANCY: hCG, Beta Chain, Quant, S: 6 m[IU]/mL — ABNORMAL HIGH (ref <5)

## 2016-10-06 MED ORDER — ONDANSETRON HCL 4 MG/2ML IJ SOLN
4.0000 mg | Freq: Once | INTRAMUSCULAR | Status: AC
  Administered 2016-10-06: 4 mg via INTRAVENOUS
  Filled 2016-10-06: qty 2

## 2016-10-06 MED ORDER — SODIUM CHLORIDE 0.9 % IV BOLUS (SEPSIS)
1000.0000 mL | Freq: Once | INTRAVENOUS | Status: AC
  Administered 2016-10-06: 1000 mL via INTRAVENOUS

## 2016-10-06 MED ORDER — IBUPROFEN 200 MG PO TABS: 600.0000 mg | ORAL_TABLET | Freq: Once | ORAL | Status: DC

## 2016-10-06 MED ORDER — IBUPROFEN 200 MG PO TABS
600.0000 mg | ORAL_TABLET | Freq: Once | ORAL | Status: AC
  Administered 2016-10-06: 600 mg via ORAL
  Filled 2016-10-06: qty 3

## 2016-10-06 NOTE — ED Notes (Signed)
Started to feel bad, got syncopal and had some vomiting, upon ems arrival pt states she had vomited last night and again x 2 today, CBG was 117  Has IV left ac

## 2016-10-06 NOTE — Clinical Social Work Note (Signed)
Clinical Social Work Assessment  Patient Details  Name: Rhonda Case MRN: [id number] Date of Birth: January 29, 1961  Date of referral:  10/06/16               Reason for consult:  Housing Concerns/Homelessness                Permission sought to share information with:    Permission granted to share information::  No  Name::        Agency::     Relationship::     Contact Information:     Housing/Transportation Living arrangements for the past 2 months:  Homeless Source of Information:  Patient Patient Interpreter Needed:  None Criminal Activity/Legal Involvement Pertinent to Current Situation/Hospitalization:    Significant Relationships:  None Lives with:  Self Do you feel safe going back to the place where you live?  No Need for family participation in patient care:  No (Coment)  Care giving concerns:  Pt states housing assistance is not available to her because she does not use drugs, drink alcohol and because she is not pregnant and that social workers have told her this.   Social Worker assessment / plan:  CSW met with pt and confirmed pt's plan to be discharged to seek shelter placement at discharge.  CSW provided active listening and validated pt's concerns there was difficulty finding long-term placement. request.  Pt has been homeless for five months, prior to being admitted to [facility].   Employment status:  Unemployed Forensic scientist:  Self Pay (Medicaid Pending) PT Recommendations:  Not assessed at this time Information / Referral to community resources:     Patient/Family's Response to care:  Patient alert and oriented.  Patient agreeable to plan.  Pt states she has no one supportive and strongly involved in pt.'s care.  Pt pleasant and appreciated CSW intervention.    Patient/Family's Understanding of and Emotional Response to Diagnosis, Current Treatment, and Prognosis:  Still assessing   Emotional Assessment Appearance:  Appears stated  age Attitude/Demeanor/Rapport:    Affect (typically observed):  Apprehensive, Overwhelmed, Flat, Anxious Orientation:  Oriented to Self, Oriented to Place, Oriented to  Time, Oriented to Situation Alcohol / Substance use:    Psych involvement (Current and /or in the community):     Discharge Needs  Concerns to be addressed:  Homelessness Readmission within the last 30 days:  No Current discharge risk:  Homeless Barriers to Discharge:  No Barriers Identified   Claudine Mouton, LCSWA 10/06/2016, 8:12 PM

## 2016-10-06 NOTE — ED Notes (Signed)
Bed: WA03 Expected date:  Expected time:  Means of arrival:  Comments: 56 yo N/V

## 2016-10-06 NOTE — ED Provider Notes (Signed)
[facility] at shift change with labs and imaging pending.   In brief, this is a 56 yo F with PMH/o HTN who present via EMS for nausea/vomiting and syncopal episode. On exam, patient had some mild epigastric and LUQ abdominal tenderness. Does not endorse any drug or ETOH use. No history of fevers/chills. No CP, SOB. She has gotten fluids and zofran in the department. No urinary or vaginal complaints. Please see [facility] for further detail and information.   Plan:  If labwork is normal and symptoms are controlled, potentially discharge home.   I-stat Beta-quant is found to be 6.0 on initial ED evaluation. Will repeat hcg.   Labs and imaging reviewed. Beta quant is 6.0, likely false positive given patient's post-menopausal status. UA shows trace leukocytes. Lipase is normal. CMP shows hyperglycemia. CBC unremarkable. CT head shows no acute abnormality. EKG NRS, rate 57.   Re-evaluation: Patient sleeping comfortably on examination table. Discussed results with patient. Abdominal tenderness has improved, patient states that she is hungry and would like to eat something. Patient reporting a slight headache. Will plan to give analgesics. Patient has taken ibuprofen without difficulty. Plan to PO challenge patient and ambulate in the ED.   Patient able to tolerate PO without any difficulty. Ambulated patient in the department without difficulty. No gait abnormalities. Patient denies any lightheadedness or dizziness. Patient is hemodynamically stable. Patient is currently homeless and does not have a discharge plan. Patient requesting to let her sleep in the ED. Explained to patient that it would not be possible. Social work has been contacted to provide her a list of shelters that she can follow-up with. Strict return precautions discussed. Patient expresses understanding and agreement to plan.     Diagnoses that have been ruled out:  None  Diagnoses that are still under  consideration:  None  Final diagnoses:  Syncope and collapse  Nausea and vomiting in adult      Rosana HoesLayden, Aldene Hendon A, PA-C 10/07/16 1029    Cathren LaineSteinl, Kevin, MD 10/07/16 1506

## 2016-10-06 NOTE — Discharge Instructions (Signed)
Follow up with the referred Nurse Practioner for further evaluation.   Use the resource guide to find a shelter.  Make sure you are staying hydrated and drinking plenty of water.   Return to the Emergency Department for any worsening nausea/vomiting, abdominal pain, chest pain, difficulty breathing, syncopal episodes or any other worsening or  concerning symptoms.

## 2016-10-06 NOTE — ED Triage Notes (Signed)
Pt from home via EMS c/o N/V that started today. Pt reports multiple episodes of emesis and is actively vomiting at registration. Pt is A&O and in NAD

## 2016-10-06 NOTE — ED Notes (Signed)
ED Provider at bedside. 

## 2016-10-06 NOTE — ED Provider Notes (Signed)
[facility]-EMERGENCY DEPT Provider Note   CSN: [id number] Arrival date & time: 10/06/16  1438     History   Chief Complaint Chief Complaint  Patient presents with  . Emesis    HPI Catherene Kaleta is a 56 y.o. female  With history of hypertension who presents today with chief complaint acute onset, progressively worsening nausea and vomiting since this morning. She states that at around 7:15 AM she became lightheaded with generalized weakness and went to sit down on a swing bench outside. She states that she took a nap and on awakening she felt very ill and vomited. She was able to have something to eat or drink at that time, but shortly thereafter began to feel sick again with multiple episodes of emesis. She states she walked to [employer] and while inside the building states that "at one point everything went black and then the next thing I knew I was on the floor ". She is unsure how long her syncopal episode lasted. She does think she hit the left side of her head and is complaining of a right-sided frontal headache which is mild and throbbing in nature. She has had multiple episodes of nausea and vomiting since then. She denies any abdominal pain but states "my stomach is in knots ". She denies urinary symptoms, vaginal symptoms, diarrhea, constipation, melena, hematochezia, fever or chills. Has not tried anything for her symptoms and she denies CP or SOB. Denies alcohol use, drug use, cigarette smoking, and states that her appetite has been good recently. The history is provided by the patient.    Past Medical History:  Diagnosis Date  . Hypertension     There are no active problems to display for this patient.   History reviewed. No pertinent surgical history.  OB History    No data available       Home Medications    Prior to Admission medications   Not on File    Family History No family history on file.  Social History Social History  Substance Use Topics  .  Smoking status: Former Games developer  . Smokeless tobacco: Never Used  . Alcohol use Yes     Allergies   Aleve [naproxen]; Codeine sulfate; Tylenol [acetaminophen]; and Lortab [hydrocodone-acetaminophen]   Review of Systems Review of Systems  Constitutional: Positive for fatigue. Negative for chills and fever.  Respiratory: Negative for shortness of breath.   Cardiovascular: Negative for chest pain.  Gastrointestinal: Positive for nausea and vomiting. Negative for abdominal pain, blood in stool, constipation and diarrhea.  Genitourinary: Positive for frequency. Negative for dysuria, flank pain, hematuria, urgency, vaginal bleeding, vaginal discharge and vaginal pain.  Neurological: Positive for syncope, light-headedness and headaches.  All other systems reviewed and are negative.    Physical Exam Updated Vital Signs BP 117/71 (BP Location: Right Arm)   Pulse 68   Temp 98.1 F (36.7 C) (Oral)   Resp 14   Ht 5\' 2"  (1.575 m)   Wt 65.8 kg (145 lb)   LMP  (LMP Unknown)   SpO2 98%   BMI 26.52 kg/m   Physical Exam  Constitutional: She appears well-developed and well-nourished. No distress.  HENT:  Head: Normocephalic and atraumatic.  Right Ear: External ear normal.  Left Ear: External ear normal.  Nose: Nose normal.  Mouth/Throat: Oropharynx is clear and moist.  No Battle's signs, no raccoon's eyes, no rhinorrhea. No hemotympanum. Mild tenderness to palpation of the left forehead and zygomatic arch without crepitus, ecchymosis, or deformity.  No tenderness to palpation of the skull.    Eyes: Pupils are equal, round, and reactive to light. Conjunctivae and EOM are normal. Right eye exhibits no discharge. Left eye exhibits no discharge.  No ecchymosis or swelling of the periorbital region  Neck: Normal range of motion. Neck supple. No JVD present. No tracheal deviation present.  No midline spine TTP, no paraspinal muscle tenderness, no deformity, crepitus, or step-off noted     Cardiovascular: Normal rate, regular rhythm, normal heart sounds and intact distal pulses.   Pulmonary/Chest: Effort normal and breath sounds normal. No respiratory distress. She has no rales. She exhibits no tenderness.  Diffuse scattered rhonchi  Abdominal: Soft. Bowel sounds are normal. She exhibits no distension. There is tenderness.  Musculoskeletal: She exhibits no edema.  Neurological: She is alert.  Mental Status:  Oriented to person and place but not time. Thought content appropriate, able to give a coherent history. Speech fluent without evidence of aphasia. Able to follow 2 step commands without difficulty.  Cranial Nerves:  II:  Peripheral visual fields grossly normal, pupils equal, round, reactive to light III,IV, VI: ptosis not present, extra-ocular motions intact bilaterally  V,VII: smile symmetric, facial light touch sensation equal VIII: hearing grossly normal to voice  X: uvula elevates symmetrically  XI: bilateral shoulder shrug symmetric and strong XII: midline tongue extension without fassiculations Motor:  Normal tone. 5/5 strength of BUE and BLE major muscle groups including strong and equal grip strength and dorsiflexion/plantar flexion Sensory: light touch normal in all extremities. Cerebellar: some dysmetria on finger-to-nose with bilateral upper extremities. Moderate swaying and feeling of instability on Romberg but no fall.  Gait: Antalgic gait due to chronic right ankle injury. Unable to heel walk or toe walk. Becomes lightheaded while ambulating.  CV: 2+ radial and DP/PT pulses   Skin: Skin is warm and dry. No erythema.  Psychiatric: She has a normal mood and affect. Her behavior is normal.  Nursing note and vitals reviewed.    ED Treatments / Results  Labs (all labs ordered are listed, but only abnormal results are displayed) Labs Reviewed  CBC WITH DIFFERENTIAL/PLATELET - Abnormal; Notable for the following:       Result Value   Lymphs Abs 0.6 (*)     All other components within normal limits  COMPREHENSIVE METABOLIC PANEL - Abnormal; Notable for the following:    Glucose, Bld 120 (*)    Total Bilirubin 1.3 (*)    All other components within normal limits  URINALYSIS, ROUTINE W REFLEX MICROSCOPIC - Abnormal; Notable for the following:    Glucose, UA >=500 (*)    Ketones, ur 80 (*)    Leukocytes, UA TRACE (*)    Squamous Epithelial / LPF 0-5 (*)    All other components within normal limits  HCG, QUANTITATIVE, PREGNANCY - Abnormal; Notable for the following:    hCG, Beta Chain, Quant, S 6 (*)    All other components within normal limits  I-STAT BETA HCG BLOOD, ED ([facility], [facility], [facility] ONLY) - Abnormal; Notable for the following:    I-stat hCG, quantitative 6.0 (*)    All other components within normal limits  LIPASE, BLOOD    EKG  EKG Interpretation  Date/Time:  Friday October 06 2016 15:54:12 EDT Ventricular Rate:  51 PR Interval:    QRS Duration: 91 QT Interval:  428 QTC Calculation: 395 R Axis:   68 Text Interpretation:  Sinus rhythm Since last tracing rate slower Confirmed by Linwood DibblesKnapp, Jon [phone]([id number]) on  10/06/2016 3:56:35 PM       Radiology Ct Head Wo Contrast  Result Date: 10/06/2016 CLINICAL DATA:  56 year old female with syncope and vomiting EXAM: CT HEAD WITHOUT CONTRAST TECHNIQUE: Contiguous axial images were obtained from the base of the skull through the vertex without intravenous contrast. COMPARISON:  Prior head CT 09/21/2016 FINDINGS: Brain: No evidence of acute infarction, hemorrhage, hydrocephalus, extra-axial collection or mass lesion/mass effect. Vascular: Mild calcifications in the bilateral cavernous carotid arteries. Skull: Normal. Negative for fracture or focal lesion. Sinuses/Orbits: No acute finding. Other: None. IMPRESSION: Negative head CT. Electronically Signed   By: Malachy Moan M.D.   On: 10/06/2016 16:57    Procedures Procedures (including critical care time)  Medications Ordered in ED Medications   ondansetron (ZOFRAN) injection 4 mg (4 mg Intravenous Given 10/06/16 1537)  sodium chloride 0.9 % bolus 1,000 mL (0 mLs Intravenous Stopped 10/06/16 1600)  ibuprofen (ADVIL,MOTRIN) tablet 600 mg (600 mg Oral Given 10/06/16 2010)     Initial Impression / Assessment and Plan / ED Course  I have reviewed the triage vital signs and the nursing notes.  Pertinent labs & imaging results that were available during my care of the patient were reviewed by me and considered in my medical decision making (see chart for details).     Patient with nausea, vomiting, and syncopal episode. Afebrile, vital signs are stable. She does have lightheadedness on ambulation and some dysmetria on finger-to-nose evaluation. EKG shows no significant changes from last with no ST segment changes or arrhythmia. Given syncope and vomiting and neuro exam, we will obtain head CT. She does have epigastric and suprapubic TTP. Possible pancreatitis resulting in nausea and vomiting. Low suspicion colitis, appendicitis, or nephrolithiasis. She does have a very slightly elevated istat beta hCG of 6; suspect this is a false positive and will obtain quantitative B-HCG. No leukocytosis, or significant electrolyte abnormality. She is not anemic.   7:23 AM Signed out to oncoming provider Layden. Awaiting head CT and remainder of lab work. If workup is reassuring and pt is tolerating PO, patient stable for discharge home with zofran for nausea and PCP follow up.   Final Clinical Impressions(s) / ED Diagnoses   Final diagnoses:  Syncope and collapse  Nausea and vomiting in adult    New Prescriptions There are no discharge medications for this patient.    Jeanie Sewer, PA-C 10/07/16 Darliss Ridgel    Linwood Dibbles, MD 10/07/16 [phone]

## 2016-10-06 NOTE — ED Notes (Signed)
Patient aware that urine sample is needed but patient is unable to void at this time.

## 2016-10-06 NOTE — Progress Notes (Addendum)
CSW met with pt and offered shelter list for homelessness resources.  Pt was appreciative and thanked the CSW.  CSW noted pt has managed care insurance (generic) which is NOT useful formplacement and pt stated she has no HX of or current use of alcohol and/or other substances.    Please reconsult if future social work needs arise.  CSW signing off, as social work intervention is no longer needed.    Jonathan F. Riffey, LCSW, LCAS, CSI Clinical Social Worker Ph: [phone]      

## 2016-10-27 DIAGNOSIS — R531 Weakness: Secondary | ICD-10-CM | POA: Diagnosis present

## 2016-10-27 DIAGNOSIS — Z87891 Personal history of nicotine dependence: Secondary | ICD-10-CM | POA: Diagnosis not present

## 2016-10-27 DIAGNOSIS — I10 Essential (primary) hypertension: Secondary | ICD-10-CM | POA: Diagnosis not present

## 2016-10-27 DIAGNOSIS — R5383 Other fatigue: Secondary | ICD-10-CM | POA: Diagnosis not present

## 2016-10-27 LAB — BASIC METABOLIC PANEL
ANION GAP: 7 (ref 5–15)
BUN: 10 mg/dL (ref 6–20)
CALCIUM: 9.3 mg/dL (ref 8.9–10.3)
CO2: 24 mmol/L (ref 22–32)
Chloride: 105 mmol/L (ref 101–111)
Creatinine, Ser: 0.77 mg/dL (ref 0.44–1.00)
GFR calc Af Amer: >60 mL/min (ref >60)
GFR calc non Af Amer: >60 mL/min (ref >60)
GLUCOSE: 100 mg/dL — AB (ref 65–99)
Potassium: 3.6 mmol/L (ref 3.5–5.1)
Sodium: 136 mmol/L (ref 135–145)

## 2016-10-27 LAB — CBC
HEMATOCRIT: 40.1 % (ref 35.0–47.0)
HEMOGLOBIN: 13.9 g/dL (ref 12.0–16.0)
MCH: 33 pg (ref 26.0–34.0)
MCHC: 34.6 g/dL (ref 32.0–36.0)
MCV: 95.3 fL (ref 80.0–100.0)
PLATELETS: 174 10*3/uL (ref 150–440)
RBC: 4.21 MIL/uL (ref 3.80–5.20)
RDW: 13.3 % (ref 11.5–14.5)
WBC: 3.5 10*3/uL — ABNORMAL LOW (ref 3.6–11.0)

## 2016-10-27 NOTE — ED Triage Notes (Signed)
Pt presents via EMS from homeless shelter. Pt presents c/o weakness. Pt reports only slept 5 hrs in past 2 days.. States walked from [address] to get to [employer]. States took nap after eating at shelter and staff had difficulty waking up so called EMR. Pt A&O x4 in triage. States she exhausted. Reports right foot pain from prior injury. Denies acute injury.

## 2016-10-28 ENCOUNTER — Emergency Department
Admission: EM | Admit: 2016-10-28 | Discharge: 2016-10-28 | Disposition: A | Discharge status: Home / Self Care | Payer: Medicaid - Out of State | Attending: Emergency Medicine | Admitting: Emergency Medicine

## 2016-10-28 DIAGNOSIS — R5383 Other fatigue: Secondary | ICD-10-CM

## 2016-10-28 NOTE — Discharge Instructions (Signed)
Your workup in the Emergency Department today was reassuring.  We did not find any specific abnormalities.  We recommend you drink plenty of fluids, take your regular medications and/or any new ones prescribed today, and follow up with the doctor(s) listed in these documents as recommended.  Please refer to the attached resource guides for information about local resources.  Return to the Emergency Department if you develop new or worsening symptoms that concern you.

## 2016-10-28 NOTE — ED Provider Notes (Signed)
[facility] Emergency Department Provider Note  ____________________________________________   First MD Initiated Contact with Patient 10/28/16 [phone]     (approximate)  I have reviewed the triage vital signs and the nursing notes.   HISTORY  Chief Complaint Weakness    HPI Rhonda Case is a 56 y.o. female Who reportedly is homeless and in [place] from [place].  She a.m. in by EMS for evaluation of fatigue.  Reportedly she went to the homeless shelter in [place] and was trying to get some sleep on a bench outside while awaiting a space inside.  She says that when they checked on her she would not wake up, so they were concerned there was something wrong with her.  She states that the paramedics came and said they were worried about her "potassium orsomething" so they brought her to the emergency department.  She states that she is just very tired because she has gotten very little sleep over the last 2 days, perhaps 5 hours total, and she came from [place] walking as well as hitchhiking and it has been exhausting.  She denies fever/chills, chest pain, shortness of breath, nausea, vomiting, abdominal pain, and dysuria.  She has an old injury to her foot which makes her limp a little bit but was able to ambulate a long distance from [place].  She is not concerned about anything at the moment, she just wants to sleep.  She is speaking in full sentences and denies any focal numbness nor weakness in any of her extremities.  He denies any recent traumatic injuries.  She states that her fatigue is severe and nothing particular makes it better nor worse.  It has been gradual in onset over the last several days.   Past Medical History:  Diagnosis Date  . Hypertension     There are no active problems to display for this patient.   No past surgical history on file.  Prior to Admission medications   Not on File    Allergies Aleve [naproxen];  Codeine sulfate; Tylenol [acetaminophen]; and Lortab [hydrocodone-acetaminophen]  No family history on file.  Social History Social History  Substance Use Topics  . Smoking status: Former Games developer  . Smokeless tobacco: Never Used  . Alcohol use Yes    Review of Systems Constitutional: No fever/chills. reports generalized fatigue Eyes: No visual changes. ENT: No sore throat. Cardiovascular: Denies chest pain. Respiratory: Denies shortness of breath. Gastrointestinal: No abdominal pain.  No nausea, no vomiting.  No diarrhea.  No constipation. Genitourinary: Negative for dysuria. Musculoskeletal: Negative for neck pain.  Negative for back pain. chronic pain in right foot Integumentary: Negative for rash. Neurological: Negative for headaches, focal weakness or numbness.   ____________________________________________   PHYSICAL EXAM:  VITAL SIGNS: ED Triage Vitals  Enc Vitals Group     BP 10/27/16 2042 127/88     Pulse Rate 10/27/16 2042 66     Resp 10/27/16 2042 14     Temp 10/27/16 2042 (!) 97.5 F (36.4 C)     Temp Source 10/27/16 2042 Oral     SpO2 10/27/16 2042 100 %     Weight 10/27/16 2043 59.9 kg (132 lb)     Height 10/27/16 2043 1.575 m ( )     Head Circumference --      Peak Flow --      Pain Score 10/27/16 2041 9     Pain Loc --      Pain Edu? --  Excl. in GC? --     Constitutional: Alert and oriented. no acute distress.  Sleeping but awakens easily to light voice Eyes: Conjunctivae are normal. pupils equal and reactive Head: Atraumatic. Nose: No congestion/rhinnorhea. Mouth/Throat: Mucous membranes are moist. Neck: No stridor.  No meningeal signs.   Cardiovascular: Normal rate, regular rhythm. Good peripheral circulation. Grossly normal heart sounds. Respiratory: Normal respiratory effort.  No retractions. Lungs CTAB. Gastrointestinal: Soft and nontender. No distention.  Musculoskeletal: No lower extremity tenderness nor edema. No gross  deformities of extremities. Neurologic:  Normal speech and language. No gross focal neurologic deficits are appreciated.  Skin:  Skin is warm, dry and intact. No rash noted. Psychiatric: the patient has somewhat pressured and tangential speech, but she has good insight into her situation and is exhibiting reasonable judgment as well.  She has no suicidal ideation and does not meet any criteria for involuntary commitment nor inpatient psychiatric treatment.  ____________________________________________   LABS (all labs ordered are listed, but only abnormal results are displayed)  Labs Reviewed  BASIC METABOLIC PANEL - Abnormal; Notable for the following:       Result Value   Glucose, Bld 100 (*)    All other components within normal limits  CBC - Abnormal; Notable for the following:    WBC 3.5 (*)    All other components within normal limits  URINALYSIS, COMPLETE (UACMP) WITH MICROSCOPIC  CBG MONITORING, ED   ____________________________________________  EKG  None - EKG not ordered by ED physician ____________________________________________  RADIOLOGY   No results found.  ____________________________________________   PROCEDURES  Critical Care performed: No   Procedure(s) performed:   Procedures   ____________________________________________   INITIAL IMPRESSION / ASSESSMENT AND PLAN / ED COURSE  Pertinent labs & imaging results that were available during my care of the patient were reviewed by me and considered in my medical decision making (see chart for details).   the patient is homeless and exhausted after traveling from [place] by walking and hitchhiking.  She complains that she is very tired but she shows good insight and judgment into the situation.  Her lab tests are essentially within normal limits with just a very slightly low white blood cell count and her vital signs are normal as well (she had a very slightly low initial temperature but this was  oral and likely a false reading; upon repeat it was normal.  She has reason to be tired and she has no other complaints.  I will discharge her and give her some information about local resources.  No evidence of acute or emergent medical condition at this time.     ____________________________________________  FINAL CLINICAL IMPRESSION(S) / ED DIAGNOSES  Final diagnoses:  Fatigue, unspecified type     MEDICATIONS GIVEN DURING THIS VISIT:  Medications - No data to display   NEW OUTPATIENT MEDICATIONS STARTED DURING THIS VISIT:  There are no discharge medications for this patient.   There are no discharge medications for this patient.   There are no discharge medications for this patient.    Note:  This document was prepared using Dragon voice recognition software and may include unintentional dictation errors.    Loleta Rose, MD 10/28/16 [phone]

## 2016-10-28 NOTE — ED Notes (Signed)
Pt has multiple social issues surrounding her place of residence. Pt states she came from [place] to stay with family in [place] or [place]. Pt states she left there after a week and a half to go to [place]. Pt states she could not get into any shelter in [place] because she did not meet criteria for them and the police constantly woke her up and would not let her sleep. Pt states she hitchhiked to [place] to get into the shelter here and then she was so tired they could not wake her up. Pt was asleep when I walked into the room to assess her. Pt ambulatory to toilet in room w/ slight limp but no complaint of inability to bear weight. Pt grew agitated when questioned regarding the underlying reasons for her to be in the ED. Pt roused to urinate at this time and after doing so, returned to stretcher, covered herself up and went back to sleep w/o complaint.

## 2016-11-05 DIAGNOSIS — M25511 Pain in right shoulder: Secondary | ICD-10-CM | POA: Diagnosis not present

## 2016-11-05 DIAGNOSIS — Z5321 Procedure and treatment not carried out due to patient leaving prior to being seen by health care provider: Secondary | ICD-10-CM | POA: Insufficient documentation

## 2016-11-05 NOTE — ED Notes (Signed)
Pt here with acems for fall out of bed. Pt appears in no acute distress, pt complains of right shoulder pain post fall.

## 2016-11-06 ENCOUNTER — Emergency Department
Admission: EM | Admit: 2016-11-06 | Discharge: 2016-11-06 | Disposition: A | Discharge status: Home / Self Care | Payer: Medicaid - Out of State | Attending: Emergency Medicine | Admitting: Emergency Medicine

## 2016-11-06 ENCOUNTER — Encounter: Payer: Self-pay | Admitting: Emergency Medicine

## 2016-11-06 NOTE — ED Notes (Signed)
Pt up to restroom.

## 2016-11-06 NOTE — ED Triage Notes (Signed)
Pt to ED via EMS from homeless shelter; pt rolled out of bed and landed on hard floor; c/o pain to outer right leg, right lower back and right scapular area; pain increases with movement;

## 2016-11-06 NOTE — ED Notes (Signed)
Pt resting quietly in w/c until vitals re-checked, pt now very talkative about being a restless sleeper and falling out of the bed causing her injuries tonight, pt states that she is used to sleeping on the streets and is very restless in her sleep due to that

## 2016-12-14 ENCOUNTER — Emergency Department: Payer: Medicaid - Out of State

## 2016-12-14 ENCOUNTER — Encounter: Payer: Self-pay | Admitting: Emergency Medicine

## 2016-12-14 ENCOUNTER — Emergency Department
Admission: EM | Admit: 2016-12-14 | Discharge: 2016-12-15 | Disposition: A | Discharge status: Home / Self Care | Payer: Medicaid - Out of State | Attending: Emergency Medicine | Admitting: Emergency Medicine

## 2016-12-14 DIAGNOSIS — H9201 Otalgia, right ear: Secondary | ICD-10-CM

## 2016-12-14 DIAGNOSIS — R42 Dizziness and giddiness: Secondary | ICD-10-CM | POA: Diagnosis present

## 2016-12-14 DIAGNOSIS — Z590 Homelessness: Secondary | ICD-10-CM | POA: Insufficient documentation

## 2016-12-14 DIAGNOSIS — N390 Urinary tract infection, site not specified: Secondary | ICD-10-CM | POA: Insufficient documentation

## 2016-12-14 DIAGNOSIS — J069 Acute upper respiratory infection, unspecified: Secondary | ICD-10-CM | POA: Diagnosis not present

## 2016-12-14 DIAGNOSIS — I10 Essential (primary) hypertension: Secondary | ICD-10-CM | POA: Insufficient documentation

## 2016-12-14 LAB — BASIC METABOLIC PANEL
Anion gap: 9 (ref 5–15)
BUN: 17 mg/dL (ref 6–20)
CALCIUM: 9.4 mg/dL (ref 8.9–10.3)
CO2: 25 mmol/L (ref 22–32)
CREATININE: 0.78 mg/dL (ref 0.44–1.00)
Chloride: 104 mmol/L (ref 101–111)
GFR calc Af Amer: >60 mL/min (ref >60)
GLUCOSE: 103 mg/dL — AB (ref 65–99)
POTASSIUM: 3.6 mmol/L (ref 3.5–5.1)
SODIUM: 138 mmol/L (ref 135–145)

## 2016-12-14 LAB — URINE DRUG SCREEN, QUALITATIVE (ARMC ONLY)
Amphetamines, Ur Screen: NOT DETECTED
Barbiturates, Ur Screen: NOT DETECTED
Benzodiazepine, Ur Scrn: NOT DETECTED
COCAINE METABOLITE, UR ~~LOC~~: NOT DETECTED
Cannabinoid 50 Ng, Ur ~~LOC~~: NOT DETECTED
MDMA (ECSTASY) UR SCREEN: NOT DETECTED
METHADONE SCREEN, URINE: NOT DETECTED
Opiate, Ur Screen: NOT DETECTED
Phencyclidine (PCP) Ur S: NOT DETECTED
TRICYCLIC, UR SCREEN: NOT DETECTED

## 2016-12-14 LAB — CBC
HEMATOCRIT: 43 % (ref 35.0–47.0)
Hemoglobin: 14.4 g/dL (ref 12.0–16.0)
MCH: 32 pg (ref 26.0–34.0)
MCHC: 33.6 g/dL (ref 32.0–36.0)
MCV: 95.3 fL (ref 80.0–100.0)
PLATELETS: 173 10*3/uL (ref 150–440)
RBC: 4.51 MIL/uL (ref 3.80–5.20)
RDW: 12.8 % (ref 11.5–14.5)
WBC: 4.8 10*3/uL (ref 3.6–11.0)

## 2016-12-14 LAB — URINALYSIS, COMPLETE (UACMP) WITH MICROSCOPIC
Bilirubin Urine: NEGATIVE
Glucose, UA: NEGATIVE mg/dL
Ketones, ur: NEGATIVE mg/dL
NITRITE: NEGATIVE
PH: 8 (ref 5.0–8.0)
Protein, ur: NEGATIVE mg/dL
SPECIFIC GRAVITY, URINE: 1.006 (ref 1.005–1.030)

## 2016-12-14 LAB — ETHANOL: Alcohol, Ethyl (B): <10 mg/dL (ref <10)

## 2016-12-14 LAB — TROPONIN I

## 2016-12-14 MED ORDER — SODIUM CHLORIDE 0.9 % IV BOLUS (SEPSIS)
1000.0000 mL | Freq: Once | INTRAVENOUS | Status: AC
  Administered 2016-12-14: 1000 mL via INTRAVENOUS

## 2016-12-14 MED ORDER — CEPHALEXIN 500 MG PO CAPS
500.0000 mg | ORAL_CAPSULE | Freq: Once | ORAL | Status: AC
  Administered 2016-12-14: 500 mg via ORAL
  Filled 2016-12-14: qty 1

## 2016-12-14 MED ORDER — CEPHALEXIN 500 MG PO CAPS: 500.0000 mg | ORAL_CAPSULE | Freq: Three times a day (TID) | ORAL | 0 refills | Status: AC

## 2016-12-14 NOTE — ED Notes (Signed)
ED Provider at bedside. 

## 2016-12-14 NOTE — ED Notes (Signed)
Pt assist to bed pan  

## 2016-12-14 NOTE — ED Provider Notes (Signed)
[facility] Emergency Department Provider Note    First MD Initiated Contact with Patient 12/14/16 1944     (approximate)  I have reviewed the triage vital signs and the nursing notes.   HISTORY  Chief Complaint Dizziness    HPI Rhonda Case is a 56 y.o. female presents with chief complaint of right ear pain cough lightheadedness increased urinary frequency and dizziness.  Denies any chest pain or shortness of breath.  Does feel like she has had some sore throat recently.  No numbness or tingling.  No recent antibiotics.  Patient is presenting from the homeless shelter.  States that pain in her right ear is constant and mild.  Past Medical History:  Diagnosis Date  . Hypertension    History reviewed. No pertinent family history. History reviewed. No pertinent surgical history. There are no active problems to display for this patient.     Prior to Admission medications   Not on File    Allergies Aleve [naproxen]; Codeine sulfate; Tylenol [acetaminophen]; and Lortab [hydrocodone-acetaminophen]    Social History Social History   Tobacco Use  . Smoking status: Never Smoker  . Smokeless tobacco: Never Used  Substance Use Topics  . Alcohol use: No  . Drug use: No    Review of Systems Patient denies headaches, rhinorrhea, blurry vision, numbness, shortness of breath, chest pain, edema, cough, abdominal pain, nausea, vomiting, diarrhea, dysuria, fevers, rashes or hallucinations unless otherwise stated above in HPI. ____________________________________________   PHYSICAL EXAM:  VITAL SIGNS: Vitals:   12/14/16 1928  BP: 109/71  Pulse: 72  Resp: 17  Temp: 98.1 F (36.7 C)  SpO2: 100%    Constitutional: Alert and oriented. Well appearing and in no acute distress. Eyes: Conjunctivae are normal.  Head: Atraumatic. Nose: No congestion/rhinnorhea. Mouth/Throat: Mucous membranes are moist.   Ears: right TM with sunken effusion,  left tm without effusion Neck: No stridor. Painless ROM.  Cardiovascular: Normal rate, regular rhythm. Grossly normal heart sounds.  Good peripheral circulation. Respiratory: Normal respiratory effort.  No retractions. Lungs CTAB. Gastrointestinal: Soft and nontender. No distention. No abdominal bruits. No CVA tenderness. Musculoskeletal: No lower extremity tenderness nor edema.  No joint effusions. Neurologic:  Normal speech and language. No gross focal neurologic deficits are appreciated. No facial droop Skin:  Skin is warm, dry and intact. No rash noted. Psychiatric: Mood and affect are normal. Speech and behavior are normal.  ____________________________________________   LABS (all labs ordered are listed, but only abnormal results are displayed)  Results for orders placed or performed during the hospital encounter of 12/14/16 (from the past 24 hour(s))  Basic metabolic panel     Status: Abnormal   Collection Time: 12/14/16  7:28 PM  Result Value Ref Range   Sodium 138 135 - 145 mmol/L   Potassium 3.6 3.5 - 5.1 mmol/L   Chloride 104 101 - 111 mmol/L   CO2 25 22 - 32 mmol/L   Glucose, Bld 103 (H) 65 - 99 mg/dL   BUN 17 6 - 20 mg/dL   Creatinine, Ser 1.610.78 0.44 - 1.00 mg/dL   Calcium 9.4 8.9 - 09.610.3 mg/dL   GFR calc non Af Amer >60 >60 mL/min   GFR calc Af Amer >60 >60 mL/min   Anion gap 9 5 - 15  CBC     Status: None   Collection Time: 12/14/16  7:28 PM  Result Value Ref Range   WBC 4.8 3.6 - 11.0 K/uL   RBC 4.51 3.80 -  5.20 MIL/uL   Hemoglobin 14.4 12.0 - 16.0 g/dL   HCT 16.143.0 09.635.0 - 04.547.0 %   MCV 95.3 80.0 - 100.0 fL   MCH 32.0 26.0 - 34.0 pg   MCHC 33.6 32.0 - 36.0 g/dL   RDW 40.912.8 81.111.5 - 91.414.5 %   Platelets 173 150 - 440 K/uL  Ethanol     Status: None   Collection Time: 12/14/16  7:28 PM  Result Value Ref Range   Alcohol, Ethyl (B) <10 <10 mg/dL  Urinalysis, Complete w Microscopic     Status: Abnormal   Collection Time: 12/14/16  7:41 PM  Result Value Ref Range    Color, Urine YELLOW (A) YELLOW   APPearance HAZY (A) CLEAR   Specific Gravity, Urine 1.006 1.005 - 1.030   pH 8.0 5.0 - 8.0   Glucose, UA NEGATIVE NEGATIVE mg/dL   Hgb urine dipstick SMALL (A) NEGATIVE   Bilirubin Urine NEGATIVE NEGATIVE   Ketones, ur NEGATIVE NEGATIVE mg/dL   Protein, ur NEGATIVE NEGATIVE mg/dL   Nitrite NEGATIVE NEGATIVE   Leukocytes, UA LARGE (A) NEGATIVE   RBC / HPF 0-5 0 - 5 RBC/hpf   WBC, UA TOO NUMEROUS TO COUNT 0 - 5 WBC/hpf   Bacteria, UA RARE (A) NONE SEEN   Squamous Epithelial / LPF 0-5 (A) NONE SEEN   Mucus PRESENT    ____________________________________________  EKG My review and personal interpretation at Time: 19:28   Indication: lightheadedness  Rate: 70  Rhythm: sinus Axis: normal Other: normal intervals, no stemi,  ____________________________________________  RADIOLOGY  I personally reviewed all radiographic images ordered to evaluate for the above acute complaints and reviewed radiology reports and findings.  These findings were personally discussed with the patient.  Please see medical record for radiology report.  ____________________________________________   PROCEDURES  Procedure(s) performed:  Procedures    Critical Care performed: no ____________________________________________   INITIAL IMPRESSION / ASSESSMENT AND PLAN / ED COURSE  Pertinent labs & imaging results that were available during my care of the patient were reviewed by me and considered in my medical decision making (see chart for details).  DDX: dehydration, aom, uri, pna, tia, cva, uti  Rhonda Case is a 56 y.o. who presents to the ED with symptoms as described above.  She is well-appearing and in no acute distress.  No focal neuro deficits.  This not clinically consistent with CVA or TIA.  Blood pressure is borderline soft therefore do suspect some component of dehydration for which I will give IV fluids.  Exam is concerning for acute otitis most likely  viral in etiology, however her urinalysis does show evidence of bacteria with many leukocytes and given her multiple symptoms will start on Keflex to cover both UTI and any component of bacterial infection in her ear she does not have reliable follow-up.  No evidence of pneumonia.  Her abdominal exam is soft and benign.  Patient tolerating oral hydration.  I do feel that she is stable and appropriate for follow-up with PCP.      ____________________________________________   FINAL CLINICAL IMPRESSION(S) / ED DIAGNOSES  Final diagnoses:  Upper respiratory tract infection, unspecified type  Ear pain, right  Urinary tract infection without hematuria, site unspecified      NEW MEDICATIONS STARTED DURING THIS VISIT:  This SmartLink is deprecated. Use AVSMEDLIST instead to display the medication list for a patient.   Note:  This document was prepared using Dragon voice recognition software and may include unintentional dictation errors.  Willy Eddy, MD 12/14/16 2110

## 2016-12-14 NOTE — ED Notes (Signed)
Pt has been medically cleared per Roxan Hockeyobinson, MD, pt discharged but unable to go back to homeless shelter until AM. Pt at fall risk for lobby so will board in 1H until able to transport in AM.

## 2016-12-14 NOTE — ED Notes (Signed)
Patient is resting comfortably at this time with no signs of distress present. Will continue to monitor.   

## 2016-12-14 NOTE — ED Notes (Signed)
Patient is resting comfortably at this time with no signs of distress present. VS stable. Will continue to monitor.   

## 2016-12-14 NOTE — ED Notes (Signed)
Patient transported to X-ray 

## 2016-12-14 NOTE — ED Notes (Signed)
Persistent attempt at reaching shelter

## 2016-12-14 NOTE — ED Triage Notes (Signed)
Pt arrived to ED via EMS from homeless shelter where EMS reports pt c/o dizziness, ear pain, cough and near syncopal episode today. Pt denies SOB and chest pain. Pt is A&O x4.

## 2016-12-15 NOTE — ED Provider Notes (Signed)
-----------------------------------------   6:33 AM on 12/15/2016 -----------------------------------------   Blood pressure 129/78, pulse 65, temperature 98.1 F (36.7 C), temperature source Oral, resp. rate 14, SpO2 94 %.  The patient had no acute events since last update.  Calm and cooperative at this time. She is homeless, unable to afford her prescriptions.  A little unsteady on her feet when she ambulates to the restroom.  States this is her baseline.  Will consult clinical social work and physical therapy to evaluate patient for recommendations and possible placement.  Disposition is pending clinical social work team recommendations.     Irean HongSung, Jade J, MD 12/15/16 [phone]

## 2016-12-15 NOTE — Care Management Note (Signed)
Case Management Note  Patient Details  Name: Rhonda Case MRN: [id number] Date of Birth: 01/31/1961  Subjective/Objective:    Provided nurse for the patient with a donated cane-quad- from CM donated items. She is adjusting it for the patient's height.                Action/Plan:   Expected Discharge Date:                  Expected Discharge Plan:     In-House Referral:     Discharge planning Services     Post Acute Care Choice:    Choice offered to:     DME Arranged:    DME Agency:     HH Arranged:    HH Agency:     Status of Service:     If discussed at [employer] Length of Stay Meetings, dates discussed:    Additional Comments:  Berna BueCheryl Demetress Tift, RN 12/15/2016, 9:12 AM

## 2016-12-15 NOTE — ED Notes (Signed)
Pt ambulatory with one assist to restroom

## 2016-12-15 NOTE — ED Notes (Signed)
Pt verbalizes d/c teaching and RX. PT given cane from CSW, pt in NAD at time of d/c, pt A&Ox4, pt to lobby via wheelchair, first nurse notified. PT waiting on cab to go back to homeless shelter

## 2016-12-15 NOTE — ED Notes (Signed)
Pt up to restroom. Bed linens changed

## 2016-12-15 NOTE — Clinical Social Work Note (Signed)
Clinical Social Work Assessment  Patient Details  Name: Rhonda Case MRN: [id number] Date of Birth: November 03, 1960  Date of referral:  12/15/16               Reason for consult:  Housing Concerns/Homelessness                Permission sought to share information with:    Permission granted to share information::  No  Name::        Agency::     Relationship::     Contact Information:     Housing/Transportation Living arrangements for the past 2 months:  [place] of Information:  Patient Patient Interpreter Needed:  None Criminal Activity/Legal Involvement Pertinent to Current Situation/Hospitalization:  No - Comment as needed Significant Relationships:  Siblings(Brother Rhonda Case  [phone]) Lives with:  Self Do you feel safe going back to the place where you live?  Yes Need for family participation in patient care:  No (Coment)  Care giving concerns: None provided   Facilities manager / plan: LCSW met and supported patient and  engaged with patient to complete an assessment. She reports she had been homeless and is now staying at [facility], LCSW called them and is awaiting a call back. Consulted with ED RN and Charge Nurse and Care Manager, who provided cane and cab voucher. Patient was very untrusting of social worker initially but came around. PT therapist saw patient who remarked she is off balance as a result of ear infection after PT assessment she agreed to receive a cane. LCSW collected information and her brother Rhonda Case has made arrangements in 2 weeks to have money for his sister and will send it up so she can go back to [place]. LCSW provided patient with medication management information, resource list and link bus ticket and handout. LCSW retrieved 2 new white T-shirts and black pants for patient to wear and purse with hygiene products. Patient was accepting of help and provisions no further needs at this time.    Employment status:   Financial risk analyst:    PT Recommendations:  Home with Home Health(PT assessed and patient would benefit from cane) Information / Referral to community resources:  Shelter  Patient/Family's Response to care: She is insistent to go back to shelter  Patient/Family's Understanding of and Emotional Response to Diagnosis, Current Treatment, and Prognosis: She understands she has ear infection which causes her to have balance issues  Emotional Assessment Appearance:  Appears older than stated age Attitude/Demeanor/Rapport:  Apprehensive, Guarded, Reactive Affect (typically observed):  Agitated, Apprehensive, Defensive Orientation:  Oriented to Self, Oriented to Place, Oriented to  Time, Oriented to Situation Alcohol / Substance use:  Not Applicable Psych involvement (Current and /or in the community):  No (Comment)  Discharge Needs  Concerns to be addressed:  Homelessness, Basic Needs, Medication Concerns Readmission within the last 30 days:  No Current discharge risk:  Homeless Barriers to Discharge:  Homeless with medical needs   [name], LCSW 12/15/2016, 9:33 AM

## 2016-12-15 NOTE — ED Notes (Signed)
Social work at bedside.  

## 2016-12-15 NOTE — ED Notes (Signed)
Patient is resting comfortably at this time with no signs of distress present. VS stable. Will continue to monitor.   

## 2016-12-15 NOTE — Progress Notes (Signed)
LCSW met and supported patient and  engaged with patient to complete an assessment. She reports she had been homeless and is now staying at [facility], LCSW called them and is awaiting a call back. Consulted with ED RN and Charge Nurse and Care Manager, who provided cane and cab voucher. Patient was very untrusting of social worker initially but came around. PT therapist saw patient who remarked she is off balance as a result of ear infection after PT assessment she agreed to receive a cane. LCSW collected information and her brother [name] has made arrangements in 2 weeks to have money for his sister and will send it up so she can go back to [place]. LCSW provided patient with medication management information, resource list and link bus ticket and handout. LCSW retrieved 2 new white T-shirts and black pants for patient to wear and purse with hygiene products.  [employer] LCSW [phone]

## 2016-12-15 NOTE — ED Notes (Signed)
Breakfast tray given at this time.  

## 2016-12-15 NOTE — Evaluation (Signed)
Physical Therapy Evaluation Patient Details Name: Rhonda Case MRN: [id number] DOB: 1960/06/19 Today's Date: 12/15/2016   History of Present Illness  Pt is a 56 y/o F who presented with right ear pain, cough, lightheadedness, increased urinary frequency, and dizziness.  She presents from the homeless shelter.  Found to have UTI and exam concerning for acute otitis most likely viral in etiolgy.  Also, suspect dehydration as a contributor.      Clinical Impression  Pt admitted with above diagnosis. Pt currently with functional limitations due to the deficits listed below (see PT Problem List). Pt talking the entire session and only briefly stops talking if she is interrupted.  When asked a questions she often does not answer it but rather her communication is tangential and off topic, limiting PT Evaluation. The UTI may be contributing to this and hopeful that if this improves she will be able to participate more in PT.  She currently requires up to min assist for transfers and to ambulate 120 ft due to instability and sudden staggering L and R. CM was able to locate a charity cane for the pt.  This PT encouraged the pt to use cane at d/c.     Follow Up Recommendations Outpatient PT(However, pt homeless and will likely not qualify for PT)    Equipment Recommendations  Cane(CM has located a charity cane for the pt)    Recommendations for Other Services       Precautions / Restrictions Precautions Precautions: Fall Restrictions Weight Bearing Restrictions: No      Mobility  Bed Mobility Overal bed mobility: Independent             General bed mobility comments: No physical assist or cues needed, pt reports dizziness with supine>sit which improves quickly.   Transfers Overall transfer level: Needs assistance Equipment used: None Transfers: Sit to/from Stand Sit to Stand: Min assist         General transfer comment: Min assist to remain stead as pt demonstrates  instability with increased postural sway in standing at bedside.   Ambulation/Gait Ambulation/Gait assistance: Min assist Ambulation Distance (Feet): 120 Feet Assistive device: None Gait Pattern/deviations: Staggering right;Staggering left;Narrow base of support   Gait velocity interpretation: at or above normal speed for age/gender General Gait Details: Pt ambulates with a narrow base of support and experiences spontaneous staggering L and/or R, requiring min assist to prevent LOB x3.  Pt able to tolerate vertical head turns without increased instability but does demonstrate mild instability with sudden stopping.   Stairs            Wheelchair Mobility    Modified Rankin (Stroke Patients Only)       Balance Overall balance assessment: Needs assistance;History of Falls Sitting-balance support: No upper extremity supported;Feet supported Sitting balance-Leahy Scale: Good     Standing balance support: No upper extremity supported;During functional activity Standing balance-Leahy Scale: Poor Standing balance comment: Pt requires min assist to stead at times in static standing             High level balance activites: Turns;Sudden stops;Head turns;Direction changes;Other (comment)(increase in gait speed, marching while ambulating) High Level Balance Comments: Increased instability with sudden stops and while marching             Pertinent Vitals/Pain Pain Assessment: Faces Faces Pain Scale: No hurt Pain Location: Pt does not appear to be in pain Pain Intervention(s): Monitored during session    Home Living Family/patient expects to be discharged to:: Shelter/Homeless  Additional Comments: Pt has been living at a homeless shelter in [place] for 3.5 months per SW    Prior Function Level of Independence: Independent         Comments: Pt reports she has not been using an AD.  She says she has fallen due to her dizziness but was unable  to focus enough to report how many times or how frequently.     Hand Dominance        Extremity/Trunk Assessment   Upper Extremity Assessment Upper Extremity Assessment: Overall WFL for tasks assessed    Lower Extremity Assessment Lower Extremity Assessment: Overall WFL for tasks assessed       Communication   Communication: No difficulties  Cognition Arousal/Alertness: Awake/alert Behavior During Therapy: Restless;Agitated Overall Cognitive Status: Difficult to assess                                 General Comments: Difficult to assess as pt does not stop talking unless interrupted.  When asked a question her answer is tangential and often does not answer the question asked.        General Comments General comments (skin integrity, edema, etc.): Pt talking the entire session and only briefly stops talking if she is interrupted.  When asked a questions she often does not answer it but rather her communication is tangential and off topic, limited PT Evaluation.  SW present during PT Evaluation session.     Exercises     Assessment/Plan    PT Assessment Patient needs continued PT services  PT Problem List Decreased balance;Decreased cognition;Decreased knowledge of use of DME;Decreased safety awareness       PT Treatment Interventions DME instruction;Gait training;Stair training;Functional mobility training;Therapeutic activities;Therapeutic exercise;Balance training;Cognitive remediation;Neuromuscular re-education;Patient/family education    PT Goals (Current goals can be found in the Care Plan section)  Acute Rehab PT Goals Patient Stated Goal: to return to the homeless shelter PT Goal Formulation: With patient Time For Goal Achievement: 12/29/16 Potential to Achieve Goals: Fair    Frequency Min 2X/week   Barriers to discharge Other (comment) Homeless with no assist available    Co-evaluation               AM-PAC PT "6 Clicks" Daily  Activity  Outcome Measure Difficulty turning over in bed (including adjusting bedclothes, sheets and blankets)?: None Difficulty moving from lying on back to sitting on the side of the bed? : None Difficulty sitting down on and standing up from a chair with arms (e.g., wheelchair, bedside commode, etc,.)?: Unable Help needed moving to and from a bed to chair (including a wheelchair)?: A Little Help needed walking in hospital room?: A Little Help needed climbing 3-5 steps with a railing? : A Little 6 Click Score: 18    End of Session Equipment Utilized During Treatment: Gait belt Activity Tolerance: Patient tolerated treatment well Patient left: in bed;with nursing/sitter in room Nurse Communication: Mobility status PT Visit Diagnosis: Unsteadiness on feet (R26.81);History of falling (Z91.81);Other abnormalities of gait and mobility (R26.89)    Time: [id number] PT Time Calculation (min) (ACUTE ONLY): 16 min   Charges:   PT Evaluation $PT Eval Low Complexity: 1 Low     PT G Codes:   PT G-Codes **NOT FOR INPATIENT CLASS** Functional Assessment Tool Used: AM-PAC 6 Clicks Basic Mobility;Clinical judgement Functional Limitation: Mobility: Walking and moving around Mobility: Walking and Moving Around Current Status ([id number]): At least  40 percent but less than 60 percent impaired, limited or restricted Mobility: Walking and Moving Around Goal Status [phone]): At least 1 percent but less than 20 percent impaired, limited or restricted    Encarnacion Chu PT, DPT 12/15/2016, 9:30 AM

## 2016-12-15 NOTE — ED Notes (Addendum)
PT and social work consult was placed by previous nurse, will wait for pt to be evaluated .Charge RN aware. Pt sleeping at this time, NAD noted, RR even and unlabored

## 2016-12-15 NOTE — ED Notes (Signed)
Patient is resting comfortably at this time with no signs of distress present. Will continue to monitor.   

## 2016-12-29 ENCOUNTER — Encounter: Payer: Self-pay | Admitting: Emergency Medicine

## 2016-12-29 ENCOUNTER — Emergency Department
Admission: EM | Admit: 2016-12-29 | Discharge: 2016-12-29 | Disposition: A | Discharge status: Home / Self Care | Payer: Medicaid - Out of State | Attending: Emergency Medicine | Admitting: Emergency Medicine

## 2016-12-29 DIAGNOSIS — J0110 Acute frontal sinusitis, unspecified: Secondary | ICD-10-CM | POA: Diagnosis not present

## 2016-12-29 DIAGNOSIS — I10 Essential (primary) hypertension: Secondary | ICD-10-CM | POA: Insufficient documentation

## 2016-12-29 DIAGNOSIS — R0981 Nasal congestion: Secondary | ICD-10-CM | POA: Diagnosis not present

## 2016-12-29 DIAGNOSIS — R070 Pain in throat: Secondary | ICD-10-CM | POA: Insufficient documentation

## 2016-12-29 DIAGNOSIS — R509 Fever, unspecified: Secondary | ICD-10-CM | POA: Insufficient documentation

## 2016-12-29 DIAGNOSIS — H9201 Otalgia, right ear: Secondary | ICD-10-CM | POA: Diagnosis present

## 2016-12-29 MED ORDER — PREDNISONE 10 MG PO TABS: ORAL_TABLET | ORAL | 0 refills | Status: DC

## 2016-12-29 MED ORDER — METHYLPREDNISOLONE SODIUM SUCC 125 MG IJ SOLR
125.0000 mg | Freq: Once | INTRAMUSCULAR | Status: AC
  Administered 2016-12-29: 125 mg via INTRAMUSCULAR
  Filled 2016-12-29: qty 2

## 2016-12-29 MED ORDER — AMOXICILLIN-POT CLAVULANATE 875-125 MG PO TABS: 1.0000 | ORAL_TABLET | Freq: Two times a day (BID) | ORAL | 0 refills | Status: AC

## 2016-12-29 NOTE — ED Triage Notes (Signed)
Patient presents to the ED with right ear pain for longer than 1 week.  Patient also complaining of nasal congestion.  Patient reports finishing a course of antibiotics, taking tylenol for pain and ear drops without relief.

## 2016-12-29 NOTE — ED Provider Notes (Signed)
[facility] Emergency Department Provider Note  ____________________________________________  Time seen: Approximately 1:05 PM  I have reviewed the triage vital signs and the nursing notes.   HISTORY  Chief Complaint Otalgia    HPI Rhonda Case is a 56 y.o. female that presents to the emergency department for evaluation of right ear pain and congestion for 2 weeks.  Patient was seen in the emergency room on November 9 and was given Keflex for an upper respiratory infection.  Most symptoms have improved the patient is still having ear pain and nasal congestion.  She is also now having pain on the right side of her throat.  It is worse with swallowing.  Yesterday she saw her yellow nasal drainage which caused her to throw up.  She has been taking ibuprofen in addition to the Keflex.  She finished her Keflex 4 days ago.  She states that she has had a fever and it was "8 above normal" but was unable to give a number. She denies headache, chills, shortness of breath, chest pain, nausea, abdominal pain.  Past Medical History:  Diagnosis Date  . Hypertension     There are no active problems to display for this patient.   History reviewed. No pertinent surgical history.  Prior to Admission medications   Medication Sig Start Date End Date Taking? Authorizing Provider  amoxicillin-clavulanate (AUGMENTIN) 875-125 MG tablet Take 1 tablet by mouth 2 (two) times daily for 10 days. 12/29/16 01/08/17  Enid DerryWagner, Raejean Swinford, PA-C  predniSONE (DELTASONE) 10 MG tablet Take 6 tablets on day 1, take 5 tablets on day 2, take 4 tablets on day 3, take 3 tablets on day 4, take 2 tablets on day 5, take 1 tablet on day 6 12/29/16   Enid DerryWagner, Charlottie Peragine, PA-C    Allergies Aleve [naproxen]; Codeine sulfate; Tylenol [acetaminophen]; and Lortab [hydrocodone-acetaminophen]  No family history on file.  Social History Social History   Tobacco Use  . Smoking status: Never Smoker  . Smokeless  tobacco: Never Used  Substance Use Topics  . Alcohol use: No  . Drug use: No     Review of Systems  ENT: Positive for congestion and rhinorrhea. Cardiovascular: No chest pain. Respiratory: Negative for cough. No SOB. Gastrointestinal: No abdominal pain.  = No diarrhea.  No constipation. Musculoskeletal: Negative for musculoskeletal pain. Skin: Negative for rash, abrasions, lacerations, ecchymosis. Neurological: Negative for headaches.   ____________________________________________   PHYSICAL EXAM:  VITAL SIGNS: ED Triage Vitals  Enc Vitals Group     BP 12/29/16 1137 126/81     Pulse Rate 12/29/16 1137 82     Resp 12/29/16 1137 16     Temp 12/29/16 1137 97.8 F (36.6 C)     Temp Source 12/29/16 1137 Oral     SpO2 12/29/16 1137 96 %     Weight 12/29/16 1141 135 lb (61.2 kg)     Height 12/29/16 1141 5\' 2"  (1.575 m)     Head Circumference --      Peak Flow --      Pain Score 12/29/16 1137 8     Pain Loc --      Pain Edu? --      Excl. in GC? --      Constitutional: Alert and oriented. Well appearing and in no acute distress. Eyes: Conjunctivae are normal. PERRL. EOMI. No discharge. Head: Atraumatic. ENT: No frontal or maxillary sinus tenderness.      Ears: Tympanic membranes pearly gray with good landmarks. No  discharge.      Nose: Mild congestion/rhinnorhea.      Mouth/Throat: Mucous membranes are moist. Oropharynx non-erythematous. Tonsils not enlarged. No exudates. Uvula midline. Neck: No stridor.   Hematological/Lymphatic/Immunilogical: Tender anterior right sided cervical lymphadenopathy.  Cardiovascular: Normal rate, regular rhythm.  Good peripheral circulation. Respiratory: Normal respiratory effort without tachypnea or retractions. Lungs CTAB. Good air entry to the bases with no decreased or absent breath sounds. Gastrointestinal: Bowel sounds 4 quadrants. Soft and nontender to palpation. No guarding or rigidity. No palpable masses. No  distention. Musculoskeletal: Full range of motion to all extremities. No gross deformities appreciated. Skin:  Skin is warm, dry and intact. No rash noted.   ____________________________________________   LABS (all labs ordered are listed, but only abnormal results are displayed)  Labs Reviewed - No data to display ____________________________________________  EKG   ____________________________________________  RADIOLOGY  No results found.  ____________________________________________    PROCEDURES  Procedure(s) performed:    Procedures    Medications  methylPREDNISolone sodium succinate (SOLU-MEDROL) 125 mg/2 mL injection 125 mg (125 mg Intramuscular Given 12/29/16 1309)   ____________________________________________   INITIAL IMPRESSION / ASSESSMENT AND PLAN / ED COURSE  Pertinent labs & imaging results that were available during my care of the patient were reviewed by me and considered in my medical decision making (see chart for details).  Review of the [place] CSRS was performed in accordance of the [facility] prior to dispensing any controlled drugs.  Patient's diagnosis is consistent with sinus infection. Vital signs and exam are reassuring.  She is given Solu-Medrol in ED. Patient feels comfortable going home. Patient will be discharged home with prescriptions for Augmentin and prednisone. Patient is to follow up with PCP as needed or otherwise directed. Patient is given ED precautions to return to the ED for any worsening or new symptoms.     ____________________________________________  FINAL CLINICAL IMPRESSION(S) / ED DIAGNOSES  Final diagnoses:  Acute non-recurrent frontal sinusitis      NEW MEDICATIONS STARTED DURING THIS VISIT:      This chart was dictated using voice recognition software/Dragon. Despite best efforts to proofread, errors can occur which can change the meaning. Any change was purely unintentional.    Enid DerryWagner, Lynnelle Mesmer,  PA-C 12/29/16 1546    Sharyn CreamerQuale, Mark, MD 12/29/16 [phone]

## 2017-01-24 ENCOUNTER — Ambulatory Visit (HOSPITAL_COMMUNITY)
Admission: EM | Admit: 2017-01-24 | Discharge: 2017-01-24 | Disposition: A | Discharge status: Home / Self Care | Payer: PRIVATE HEALTH INSURANCE | Attending: Family Medicine | Admitting: Family Medicine

## 2017-01-24 ENCOUNTER — Other Ambulatory Visit: Payer: Self-pay

## 2017-01-24 ENCOUNTER — Encounter (HOSPITAL_COMMUNITY): Payer: Self-pay | Admitting: Emergency Medicine

## 2017-01-24 DIAGNOSIS — J0111 Acute recurrent frontal sinusitis: Secondary | ICD-10-CM

## 2017-01-24 MED ORDER — FLUTICASONE PROPIONATE 50 MCG/ACT NA SUSP: 2.0000 | Freq: Every day | NASAL | 0 refills | Status: DC

## 2017-01-24 MED ORDER — CETIRIZINE-PSEUDOEPHEDRINE ER 5-120 MG PO TB12: 1.0000 | ORAL_TABLET | Freq: Every day | ORAL | 0 refills | Status: DC

## 2017-01-24 MED ORDER — AMOXICILLIN-POT CLAVULANATE 875-125 MG PO TABS: 1.0000 | ORAL_TABLET | Freq: Two times a day (BID) | ORAL | 0 refills | Status: DC

## 2017-01-24 NOTE — ED Provider Notes (Signed)
[facility]    CSN: [id number] Arrival date & time: 01/24/17  1004     History   Chief Complaint Chief Complaint  Patient presents with  . Otalgia    HPI Rhonda Case is a 56 y.o. female.   56 year old female comes in for 2 week history of right ear otalgia with nasal congestion/rhinorrhea. She has had minimal cough that can be productive. Denies fever, chills, night sweats. She states she saw another urgent care a week ago, and was told to take some ibuprofen, which has not been helping. States she does use cotton swabs to clean her ear. Denies swimming, altitude changes. States she was started on Augmentin last month but did not finish course.       Past Medical History:  Diagnosis Date  . Hypertension     There are no active problems to display for this patient.   History reviewed. No pertinent surgical history.  OB History    No data available       Home Medications    Prior to Admission medications   Medication Sig Start Date End Date Taking? Authorizing Provider  amoxicillin-clavulanate (AUGMENTIN) 875-125 MG tablet Take 1 tablet by mouth every 12 (twelve) hours. 01/24/17   Cathie HoopsYu, Brenn Deziel V, PA-C  cetirizine-pseudoephedrine (ZYRTEC-D) 5-120 MG tablet Take 1 tablet by mouth daily. 01/24/17   Cathie HoopsYu, Aruna Nestler V, PA-C  fluticasone (FLONASE) 50 MCG/ACT nasal spray Place 2 sprays into both nostrils daily. 01/24/17   Belinda FisherYu, Ludene Stokke V, PA-C    Family History No family history on file.  Social History Social History   Tobacco Use  . Smoking status: Never Smoker  . Smokeless tobacco: Never Used  Substance Use Topics  . Alcohol use: No  . Drug use: No     Allergies   Aleve [naproxen]; Codeine sulfate; Tylenol [acetaminophen]; and Lortab [hydrocodone-acetaminophen]   Review of Systems Review of Systems  Reason unable to perform ROS: See HPI as above.     Physical Exam Triage Vital Signs ED Triage Vitals  Enc Vitals Group     BP 01/24/17 1027 113/69       Pulse Rate 01/24/17 1027 73     Resp 01/24/17 1027 16     Temp 01/24/17 1027 98 F (36.7 C)     Temp Source 01/24/17 1027 Oral     SpO2 01/24/17 1027 99 %     Weight --      Height --      Head Circumference --      Peak Flow --      Pain Score 01/24/17 1029 7     Pain Loc --      Pain Edu? --      Excl. in GC? --    No data found.  Updated Vital Signs BP 113/69 (BP Location: Right Arm)   Pulse 73   Temp 98 F (36.7 C) (Oral)   Resp 16   LMP  (LMP Unknown)   SpO2 99%   Physical Exam  Constitutional: She is oriented to person, place, and time. She appears well-developed and well-nourished. No distress.  HENT:  Head: Normocephalic and atraumatic.  Right Ear: Tympanic membrane, external ear and ear canal normal. Tympanic membrane is not erythematous and not bulging.  Left Ear: Tympanic membrane, external ear and ear canal normal. Tympanic membrane is not erythematous and not bulging.  Nose: Mucosal edema and rhinorrhea present. Right sinus exhibits maxillary sinus tenderness. Right sinus exhibits no frontal  sinus tenderness. Left sinus exhibits maxillary sinus tenderness. Left sinus exhibits no frontal sinus tenderness.  Mouth/Throat: Uvula is midline, oropharynx is clear and moist and mucous membranes are normal.  Eyes: Conjunctivae are normal. Pupils are equal, round, and reactive to light.  Neck: Normal range of motion. Neck supple.  Cardiovascular: Normal rate, regular rhythm and normal heart sounds. Exam reveals no gallop and no friction rub.  No murmur heard. Pulmonary/Chest: Effort normal and breath sounds normal. She has no decreased breath sounds. She has no wheezes. She has no rhonchi. She has no rales.  Lymphadenopathy:    She has cervical adenopathy.  Neurological: She is alert and oriented to person, place, and time.  Skin: Skin is warm and dry.  Psychiatric: She has a normal mood and affect. Her behavior is normal. Judgment normal.     UC Treatments /  Results  Labs (all labs ordered are listed, but only abnormal results are displayed) Labs Reviewed - No data to display  EKG  EKG Interpretation None       Radiology No results found.  Procedures Procedures (including critical care time)  Medications Ordered in UC Medications - No data to display   Initial Impression / Assessment and Plan / UC Course  I have reviewed the triage vital signs and the nursing notes.  Pertinent labs & imaging results that were available during my care of the patient were reviewed by me and considered in my medical decision making (see chart for details).    Start augmentin for sinusitis. Other symptomatic treatment discussed. ENT information provided. Return precautions given.   Final Clinical Impressions(s) / UC Diagnoses   Final diagnoses:  Acute recurrent frontal sinusitis    ED Discharge Orders        Ordered    amoxicillin-clavulanate (AUGMENTIN) 875-125 MG tablet  Every 12 hours     01/24/17 1047    fluticasone (FLONASE) 50 MCG/ACT nasal spray  Daily     01/24/17 1047    cetirizine-pseudoephedrine (ZYRTEC-D) 5-120 MG tablet  Daily     01/24/17 1047        Belinda FisherYu, Asiyah Pineau V, [place]PA-C 01/24/17 1123

## 2017-01-24 NOTE — Discharge Instructions (Signed)
Tart augmentin as directed. Start flonase, zyrtec-D for nasal congestion. You can use over the counter nasal saline rinse such as neti pot for nasal congestion. Keep hydrated, your urine should be clear to pale yellow in color. Tylenol/motrin for fever and pain. Monitor for any worsening of symptoms, chest pain, shortness of breath, wheezing, swelling of the throat, follow up for reevaluation. If symptoms continues, does not resolve, worsens, follow up with ear nose and throat doctor.

## 2017-01-24 NOTE — ED Triage Notes (Signed)
Pt c/o R ear pain two weeks.

## 2017-01-26 ENCOUNTER — Other Ambulatory Visit: Payer: Self-pay

## 2017-01-26 ENCOUNTER — Inpatient Hospital Stay (HOSPITAL_COMMUNITY)
Admission: AD | Admit: 2017-01-26 | Discharge: 2017-01-26 | Disposition: A | Discharge status: Home / Self Care | Payer: Medicaid - Out of State | Source: Ambulatory Visit | Attending: Obstetrics & Gynecology | Admitting: Obstetrics & Gynecology

## 2017-01-26 DIAGNOSIS — J329 Chronic sinusitis, unspecified: Secondary | ICD-10-CM | POA: Diagnosis not present

## 2017-01-26 DIAGNOSIS — I10 Essential (primary) hypertension: Secondary | ICD-10-CM | POA: Diagnosis not present

## 2017-01-26 DIAGNOSIS — J029 Acute pharyngitis, unspecified: Secondary | ICD-10-CM | POA: Diagnosis present

## 2017-01-26 DIAGNOSIS — J310 Chronic rhinitis: Secondary | ICD-10-CM

## 2017-01-26 DIAGNOSIS — J328 Other chronic sinusitis: Secondary | ICD-10-CM | POA: Insufficient documentation

## 2017-01-26 LAB — INFLUENZA PANEL BY PCR (TYPE A & B)
INFLAPCR: NEGATIVE
INFLBPCR: NEGATIVE

## 2017-01-26 LAB — RAPID STREP SCREEN (MED CTR MEBANE ONLY): Streptococcus, Group A Screen (Direct): NEGATIVE

## 2017-01-26 MED ORDER — PSEUDOEPHEDRINE HCL 60 MG PO TABS: 60.0000 mg | ORAL_TABLET | ORAL | 0 refills | Status: AC | PRN

## 2017-01-26 MED ORDER — CETIRIZINE HCL 10 MG PO TABS: 10.0000 mg | ORAL_TABLET | Freq: Every day | ORAL | 0 refills | Status: AC

## 2017-01-26 MED ORDER — AMOXICILLIN 500 MG PO CAPS
500.0000 mg | ORAL_CAPSULE | Freq: Three times a day (TID) | ORAL | Status: DC
  Administered 2017-01-26: 500 mg via ORAL
  Filled 2017-01-26 (×3): qty 1

## 2017-01-26 MED ORDER — PSEUDOEPHEDRINE HCL 30 MG PO TABS
60.0000 mg | ORAL_TABLET | Freq: Once | ORAL | Status: AC
  Administered 2017-01-26: 60 mg via ORAL
  Filled 2017-01-26: qty 2

## 2017-01-26 MED ORDER — AMOXICILLIN 500 MG PO CAPS: 500.0000 mg | ORAL_CAPSULE | Freq: Three times a day (TID) | ORAL | 0 refills | Status: AC

## 2017-01-26 MED ORDER — LORATADINE 10 MG PO TABS
10.0000 mg | ORAL_TABLET | Freq: Every day | ORAL | Status: DC
  Administered 2017-01-26: 10 mg via ORAL
  Filled 2017-01-26 (×2): qty 1

## 2017-01-26 NOTE — MAU Note (Signed)
Erin Lawrence NP in Triage talking with pt 

## 2017-01-26 NOTE — Discharge Instructions (Signed)
[place] (Revised August 2014)    Insufficient Money for Medicine:           [address]: call "211"    MAP Program at [place] or HP [phone]            No Primary Care Doctor:  To locate a primary care doctor that accepts your insurance or provides certain services:           [place]: [phone]           Physician Referral Service: [phone] ask for My [place]  If no insurance, you need to see if you qualify for [facility] orange card, call to set      up appointment for eligibility/enrollment at [phone] or [phone] or visit [place] ([address], [place] and [place]) to meet with a [facility] enrollment specialist.  Agencies that provide inexpensive (sliding fee scale) medical care:       [facility] - Family Medicine at [place] - [phone]     [facility] and [place] - [facility] - [place] [phone]     [facility] for Children - [place] [phone]  [facility] - [place] @ [place] [phone]  [facility] - [place] @ [place] - [phone]  [facility]: [phone]   Women's Clinic: [phone]   Planned Parenthood: [phone]   [facility] of the [place]    [place] Providers:           [facility] [phone] (No Family Planning accepted)          [address], [phone], Mon-Fri 9am-5pm          [facility] - [phone]  [place], [address] [place], Mon-Thursday 8am-5pm, Fri 8am-noon  [employer] -  [place], [address], Mon-Fri 7:30am-4:30pm          [place] - [phone]          [address], [facility] - [phone] [address], [address]          Only accepts [place] [employer] patients after they have their name applied to their card  Self Pay (no insurance) in [facility]:           Sickle Cell Patients:   [place], [phone] [facility]:  [address] [place] [phone]       [facility]  [address] [place] [phone]  [place]:  [address] [phone]          [facility]           [place], [phone] [facility] for [place], [phone]           [facility] [place]           [place], [address], [place] [address]           [phone], Mon-Fri 9am-7pm, [place] 9am-1pm          [facility] - Family Medicine @ [place], [place]          [facility] - [facility]           [address] [place] [facility] Adult and [place]  [address] [place] [phone]          [place], [place]  [facility] - [place]   [place], [place] [phone] [facility] and [place]  [address] [phone]  Dr. Vista Lawman           [address], [place], [facility]           [address] [phone]          [facility]             [address]  [phone]          [facility]           [place], [place], 1st & 3rd Saturday every month, 10am-1pm OTHERS:  [facility]  ([facility] Only)  [phone] (Thursday only) Strategies for finding a Primary Care Provider:  1) Find a Doctor and Pay Out of Pocket  Although you won't have to find out who is covered by your insurance plan, it is a good idea to ask around and get recommendations. You will then need to call the office and see if the doctor you have chosen will accept you as a new patient and what types of options they offer for patients who are self-pay. Some doctors offer discounts or will set up payment plans for their patients who do not have insurance, but you will need to ask so you aren't surprised when you get to your appointment.  2) [place] - To see if you qualify for orange card access to healthcare safety net providers.  Call for appointment for eligibility/enrollment at [phone] or [phone]. (Uninsured, 0-200% FPL, qualifying info)  Applicants for [facility] are first required to see if they are eligible to enroll in the [facility] Marketplace before enrolling in [facility] (and get an exemption if they are not).  GCCN Criteria for acceptance is:  ? Proof of ACA Marketing exemption - form or documentation  ? Valid photo ID (driver's license, state identification card, passport, home country ID)  ? Proof of [facility] residency (e.g. drivers license, lease/landlord information, pay stubs with address, utility bill, bank statement, etc.)  ? Proof of income (1040, last year's tax return, W2, 4 current pay stubs, other income proof)  ? Proof of assets (current bank statement +  3 most recent, disability paperwork, life insurance info, tax value on autos, etc.)  3) [place] Department  Not all health departments have doctors that can see patients for sick visits, but many do, so it is worth a call  to see if yours does. If you don't know where your local health department is, you can check in your phone book. The CDC also has a tool to help you locate your state's health department, and many state websites also have listings of all of their local health departments.  4) Find a [place] [facility]  If your illness is not likely to be very severe or complicated, you may want to try a walk in clinic. These are popping up all over the country in pharmacies, drugstores, and shopping centers. They're usually staffed by nurse practitioners or physician assistants that have been trained to treat common illnesses and complaints. They're usually fairly quick and inexpensive. However, if you have serious medical issues or chronic medical problems, these are probably not your best option   STD Testing:           [place], [place] [facility]           [address] [place], phone [phone] or [phone]           Monday - Friday, call for an appointment          [place], [place] [facility]           [address], [place], phone [phone] or [phone]           Monday - Friday, call for an appointment Abuse/Neglect:           [place]: [place]: [phone] (After Hours) Emergency Shelter:  [facility] [phone]  Ages- [phone]  [place] - [phone]  Youth Focus - Act Together - [phone] (ages 32-17)  [place] @ [employer] - [phone]   Mammograms - Free at [facility] [place]:           Room at the [place]: [phone]   (Homeless mother with children)          [place]: [phone] (Mothers only)  Youth Focus: [phone] (Pregnant 3-21 years old)  Adopt a Mom  [phone]  [facility]   [facility] and [place]  [address] [place] [phone]          [facility] [place]           [address], [place], [place]          [facility] Dept.           [place], [place]  [place] [facility]           [phone]          [facility] in [place]           [phone]           [phone] (After Hours)  [place] Abuse Resources:           Alcohol and Drug Services: [phone]           [facility]: [phone]          [facility]: [phone]   Narcotics Helpline - [phone]          [facility]: [phone]           Residential & Outpatient Substance Abuse Program - [place]: [phone]  [employer]  [place] and [place] - [phone] Psychological Services:          [place]: [phone]   Therapeutic Alternatives: [phone]          [place]           [address]: [phone]     (24 Hour)  Mobile Crisis:   [place]:  Radio producer on [place] [phone] [facility] on [place] [phone]  Walk In [place]  ([place] - [phone] or [phone]  [place]. [place] [phone]  [place] [address] [place] [phone]   Dental Assistance:  If unable to pay or uninsured, contact: [facility] to become qualified for the adult dental clinic. Patient must be enrolled in [facility] (uninsured, 0-200% FPL, qualifying info).  Enroll in [facility] first, then see Primary Care Physician assigned to you, the PCP makes a dental referral. [place] [facility] will receive referral and contacts patient for appointment.  Patients with Medicaid           [address], [place] (Children up to 80 + Pregnant Women) - [phone]  [place] - Suite [phone]  If unable to pay, or uninsured: contact [place] [phone] in [place] - ([place] only + Pregnant Women), [phone] in [place] only) to become qualified for the adult dental clinic  Must see if eligible to enroll in [place] before enrolling into the [facility] (exemption required) [phone] for an appointment)  [email or web address];   [phone].  If not eligible for ACA, then go by Department of Health and Human Services to see if eligible for orange card.  [address], [place].  Once you get an orange card, you will have a Primary Care home who will then refer you to dental if needed.  Other IT consultant:   [facility] [phone] (ext [phone])   [address]  Dr. Stevan Eberwein [place] - [phone]   [address]    [place] - [phone]   [facility]           [address] [place], [place], [place], [address]           [phone], Ext. [phone]           2nd and 4th Thursday of the month at 6:30am (Simple extractions only - no wisdom teeth or surgery) First come/First serve -First 10 clients served           [facility] [place], [place] and [place] residents only)          [address] [place], [place], [place], [address]           [phone]                    [facility] Health Department           [phone]          [facility] Health Department          [phone]         [facility] Health Department - [facility]          [phone]   Transportation Options:  Ambulance - 911 - [phone] per ride Family Member to accompany patient (if stable) [place] [employer] - [phone]  PART - [phone]  Taxi - [phone] - [place]  SCAT - [phone] (Application required)  [facility] - [phone]     Sinusitis, Adult Sinusitis is soreness and inflammation of your sinuses. Sinuses are hollow spaces in the bones around your face. Your sinuses are located:  Around your eyes.  In the middle of your forehead.  Behind your nose.  In your cheekbones.  Your sinuses and nasal passages are lined with a stringy fluid (mucus). Mucus normally drains out of your sinuses. When your nasal tissues become inflamed or swollen, the mucus can become trapped or blocked so air cannot flow through your sinuses. This allows bacteria, viruses, and funguses to grow, which leads to infection. Sinusitis can develop quickly and last for 7?10 days (acute) or for more than 12 weeks (chronic). Sinusitis often develops after a cold. What are the causes? This condition is caused by anything that creates swelling in the sinuses or stops mucus from draining, including:  Allergies.  Asthma.  Bacterial or viral infection.  Abnormally shaped bones between the nasal passages.  Nasal growths that contain mucus (nasal polyps).  Narrow sinus openings.  Pollutants, such as chemicals or irritants in the air.  A foreign object stuck in the nose.  A fungal infection. This is rare.  What increases the risk? The following factors may make you more likely to develop this  condition:  Having allergies or asthma.  Having had a recent cold or respiratory tract infection.  Having structural deformities or blockages in your nose or sinuses.  Having a weak immune system.  Doing a lot of swimming or diving.  Overusing nasal sprays.  Smoking.  What are the signs or symptoms? The main symptoms of this condition are pain and a feeling of pressure around the affected sinuses. Other symptoms include:  Upper toothache.  Earache.  Headache.  Bad breath.  Decreased sense of smell and taste.  A cough that may get worse at night.  Fatigue.  Fever.  Thick drainage from your nose. The drainage is often green and it may contain pus (purulent).  Stuffy nose or congestion.  Postnasal drip. This is when extra mucus collects in the throat or back of the nose.  Swelling and warmth over the affected sinuses.  Sore throat.  Sensitivity to light.  How is this diagnosed? This condition is diagnosed based on symptoms, a medical history, and a physical exam. To find out if your condition is acute or chronic, your health care provider may:  Look in your nose for signs of nasal polyps.  Tap over the affected sinus to check for signs of infection.  View the inside of your sinuses using an imaging device that has a light attached (endoscope).  If your health care provider suspects that you have chronic sinusitis, you may also:  Be tested for allergies.  Have a sample of mucus taken from your nose (nasal culture) and checked for bacteria.  Have a mucus sample examined to see if your sinusitis is related to an allergy.  If your sinusitis does not respond to treatment and it lasts longer than 8 weeks, you may have an MRI or CT scan to check your sinuses. These scans also help to determine how severe your infection is. In rare cases, a bone biopsy may be done to rule out more serious types of fungal sinus disease. How is this treated? Treatment for  sinusitis depends on the cause and whether your condition is chronic or acute. If a virus is causing your sinusitis, your symptoms will go away on their own within 10 days. You may be given medicines to relieve your symptoms, including:  Topical nasal decongestants. They shrink swollen nasal passages and let mucus drain from your sinuses.  Antihistamines. These drugs block inflammation that is triggered by allergies. This can help to ease swelling in your nose and sinuses.  Topical nasal corticosteroids. These are nasal sprays that ease inflammation and swelling in your nose and sinuses.  Nasal saline washes. These rinses can help to get rid of thick mucus in your nose.  If your condition is caused by bacteria, you will be given an antibiotic medicine. If your condition is caused by a fungus, you will be given an antifungal medicine. Surgery may be needed to correct underlying conditions, such as narrow nasal passages. Surgery may also be needed to remove polyps. Follow these instructions at home: Medicines  Take, use, or apply over-the-counter and prescription medicines only as told by your health care provider. These may include nasal sprays.  If you were prescribed an antibiotic medicine, take it as told by your health care provider. Do not stop taking the antibiotic even if you start to feel better. Hydrate and Humidify  Drink enough water to keep your urine clear or pale yellow. Staying hydrated will help to thin your mucus.  Use a cool mist humidifier to keep the humidity level in your home above 50%.  Inhale steam for 10-15 minutes, 3-4 times a day or as told by your health care provider. You can do this in the bathroom while a hot shower is running.  Limit your exposure to cool or dry air. Rest  Rest as much as possible.  Sleep with your head raised (elevated).  Make sure to get enough sleep each night. General instructions  Apply a warm, moist  washcloth to your face 3-4  times a day or as told by your health care provider. This will help with discomfort.  Wash your hands often with soap and water to reduce your exposure to viruses and other germs. If soap and water are not available, use hand sanitizer.  Do not smoke. Avoid being around people who are smoking (secondhand smoke).  Keep all follow-up visits as told by your health care provider. This is important. Contact a health care provider if:  You have a fever.  Your symptoms get worse.  Your symptoms do not improve within 10 days. Get help right away if:  You have a severe headache.  You have persistent vomiting.  You have pain or swelling around your face or eyes.  You have vision problems.  You develop confusion.  Your neck is stiff.  You have trouble breathing. This information is not intended to replace advice given to you by your health care provider. Make sure you discuss any questions you have with your health care provider. Document Released: 01/23/2005 Document Revised: 09/19/2015 Document Reviewed: 11/18/2014 Elsevier Interactive Patient Education  [employer]2018 Elsevier Inc.

## 2017-01-26 NOTE — MAU Note (Addendum)
Had ear infection for awhile and took medicine and that is better. Have tightness in face, eyes and nose running, throat itchy and  coughing up mucous that is yellow. Some pain mid to upper back that is sharp when breathes in

## 2017-01-26 NOTE — Progress Notes (Addendum)
Judeth HornErin Lawrence NP in to discuss d/c plan and medications with pt. Written and verbal d/c instructions given and understanding voiced. Pt given list of resources of shelters, medication assistance, and health care providers in [place]

## 2017-01-26 NOTE — MAU Note (Addendum)
RN to collect FLU sample and Throat culture, collected and sent to lab.  (Pt. Will not allow nurse to collect specimen - pt. Desires to collect them herself.) RN observe procedure and sent to lab. Provider notified.

## 2017-01-27 NOTE — MAU Provider Note (Signed)
History     CSN: [id number]  Arrival date and time: 01/26/17 [id number]   First Provider Initiated Contact with Patient 01/26/17 2050      Chief Complaint  Patient presents with  . Nasal Congestion  . Sore Throat   HPI  Rhonda Case is a 56 y.o. non pregnant female who presents to [facility] reporting nasal congestion, sore throat, & sinus pain. Was seen 2 days ago at urgent care; diagnosed with sinusitis & rx antibiotics. Patient is homeless & states she was unable to start medication d/t cost ($170). Symptoms have continued.  Reports sinus pressure, watery eyes, runny nose, congestion, sore throat, & productive cough. Denies chill, SOB, CP, or ear pain. Has not treated symptoms.     Past Medical History:  Diagnosis Date  . Hypertension     No past surgical history on file.  No family history on file.  Social History   Tobacco Use  . Smoking status: Never Smoker  . Smokeless tobacco: Never Used  Substance Use Topics  . Alcohol use: No  . Drug use: No    Allergies:  Allergies  Allergen Reactions  . Aleve [Naproxen] Nausea And Vomiting    rash  . Codeine Sulfate   . Tylenol [Acetaminophen]     Cramping   . Lortab [Hydrocodone-Acetaminophen] Palpitations    Heart beat real fast    No medications prior to admission.    Review of Systems  Constitutional: Positive for fatigue. Negative for chills and fever.  HENT: Positive for congestion, ear pain, rhinorrhea, sinus pressure, sinus pain and sore throat. Negative for sneezing and voice change.   Respiratory: Positive for cough. Negative for shortness of breath and wheezing.   Cardiovascular: Negative for chest pain.   Physical Exam   Blood pressure 124/83, pulse 75, temperature 97.7 F (36.5 C), resp. rate 18, height 5\' 2"  (1.575 m), weight 156 lb (70.8 kg).  Physical Exam  Constitutional: She is oriented to person, place, and time. Vital signs are normal. She appears ill.  Appears older than  stated age.   Eyes: Conjunctivae are normal. Right eye exhibits discharge. Left eye exhibits discharge (clear watery discharge).  Cardiovascular: Normal rate and regular rhythm.  Respiratory: Effort normal. No respiratory distress. She has no wheezes.  Musculoskeletal: Normal range of motion.  Neurological: She is alert and oriented to person, place, and time.  Skin: Skin is warm and dry.  Psychiatric: She has a normal mood and affect. Her behavior is normal.    MAU Course  Procedures Results for orders placed or performed during the hospital encounter of 01/26/17 (from the past 24 hour(s))  Influenza panel by PCR (type A & B)     Status: None   Collection Time: 01/26/17  9:05 PM  Result Value Ref Range   Influenza A By PCR NEGATIVE NEGATIVE   Influenza B By PCR NEGATIVE NEGATIVE  Rapid Strep Screen (Not at [facility])     Status: None   Collection Time: 01/26/17  9:05 PM  Result Value Ref Range   Streptococcus, Group A Screen (Direct) NEGATIVE NEGATIVE    MDM VSS Flu & strep swab collected Patient given doses of medications in triage prior to discharge. Will switch out medications to more affordable option & provide with goodrx coupon card.   Assessment and Plan  A: 1. Rhinosinusitis    P: Discharge home Goodrx card provided Rx amoxicillin, zyrtec & sudafed Flu & strep swabs negative Pt to go to urgent care or  continued or worsening symptoms  Judeth Hornrin Glorious Flicker 01/27/2017, 9:08 AM

## 2017-01-28 LAB — CULTURE, GROUP A STREP (THRC)

## 2018-03-30 ENCOUNTER — Emergency Department: Admit: 2018-03-30 | Payer: MEDICAID

## 2018-03-30 ENCOUNTER — Inpatient Hospital Stay
Admit: 2018-03-30 | Discharge: 2018-03-30 | Disposition: A | Discharge status: Home / Self Care | Payer: MEDICAID | Attending: Emergency Medicine

## 2018-03-30 DIAGNOSIS — B342 Coronavirus infection, unspecified: Secondary | ICD-10-CM

## 2018-03-30 LAB — CBC WITH AUTO DIFFERENTIAL
Basophils %: 1 % (ref 0–2)
Basophils Absolute: 0 10*3/uL (ref 0.0–0.1)
Eosinophils %: 4 % (ref 0–5)
Eosinophils Absolute: 0.2 10*3/uL (ref 0.0–0.5)
Granulocyte Absolute Count: 0 10*3/uL
Hematocrit: 43.2 % (ref 41–53)
Hemoglobin: 14.2 g/dL (ref 12.0–16.0)
Immature Granulocytes: 0 % — ABNORMAL LOW (ref 2–10)
Lymphocytes %: 26 % (ref 19–48)
Lymphocytes Absolute: 1.1 10*3/uL (ref 0.8–3.5)
MCH: 32.2 PG — ABNORMAL HIGH (ref 27–31)
MCHC: 32.9 g/dL (ref 31–37)
MCV: 98 FL (ref 80–100)
MPV: 10.4 FL — ABNORMAL HIGH (ref 5.9–10.3)
Monocytes %: 12 % — ABNORMAL HIGH (ref 3–9)
Monocytes Absolute: 0.5 10*3/uL — ABNORMAL LOW (ref 0.8–3.5)
Neutrophils %: 57 % (ref 40–74)
Neutrophils Absolute: 2.4 10*3/uL (ref 1.5–8.0)
Platelets: 175 10*3/uL (ref 130–400)
RBC: 4.41 M/uL (ref 4.2–5.4)
RDW: 11.9 % (ref 11.5–14.5)
WBC: 4.1 10*3/uL — ABNORMAL LOW (ref 4.5–10.8)

## 2018-03-30 LAB — BASIC METABOLIC PANEL
Anion Gap: 7 mmol/L (ref 6–15)
BUN: 9 MG/DL (ref 7–18)
Bun/Cre Ratio: 13 (ref 7–25)
CO2: 29 mmol/L (ref 21–32)
Calcium: 9.2 MG/DL (ref 8.5–10.1)
Chloride: 104 mmol/L (ref 98–107)
Creatinine: 0.69 MG/DL (ref 0.60–1.30)
EGFR IF NonAfrican American: >60 mL/min/{1.73_m2} (ref >60)
GFR African American: >60 mL/min/{1.73_m2} (ref >60)
Glucose: 93 mg/dL (ref 70–110)
Potassium: 3.6 mmol/L (ref 3.5–5.3)
Sodium: 140 mmol/L (ref 136–145)

## 2018-03-30 LAB — MAGNESIUM
Magnesium: 2.1 mg/dL (ref 1.8–2.4)
Magnesium: 2.1 mg/dL (ref 1.8–2.4)

## 2018-03-30 LAB — RESPIRATORY PANEL,PCR,NASOPHARYNGEAL
Adenovirus: NOT DETECTED
B. parapertussis, PCR: NOT DETECTED
Bordetella pertussis - PCR: NOT DETECTED
Chlamydophila pneumoniae DNA, QL, PCR: NOT DETECTED
Coronavirus 229E: NOT DETECTED
Coronavirus CVNL63: NOT DETECTED
Coronavirus HKU1: DETECTED — AB
Coronavirus OC43: NOT DETECTED
INFLUENZA A H1N1 PCR: NOT DETECTED
Influenza A, subtype H1: NOT DETECTED
Influenza A, subtype H3: NOT DETECTED
Influenza A: NOT DETECTED
Influenza B: NOT DETECTED
Metapneumovirus: NOT DETECTED
Mycoplasma pneumoniae DNA, QL, PCR: NOT DETECTED
Parainfluenza 1: NOT DETECTED
Parainfluenza 2: NOT DETECTED
Parainfluenza 3: NOT DETECTED
Parainfluenza virus 4: NOT DETECTED
RSV by PCR: NOT DETECTED
Rhinovirus and Enterovirus: NOT DETECTED

## 2018-03-30 LAB — METABOLIC PANEL, BASIC
Anion gap: 7 mmol/L (ref 6–15)
BUN/Creatinine ratio: 13 (ref 7–25)
BUN: 9 MG/DL (ref 7–18)
CO2: 29 mmol/L (ref 21–32)
Calcium: 9.2 MG/DL (ref 8.5–10.1)
Chloride: 104 mmol/L (ref 98–107)
Creatinine: 0.69 MG/DL (ref 0.60–1.30)
GFR est AA: >60 mL/min/{1.73_m2} (ref >60)
GFR est non-AA: >60 mL/min/{1.73_m2} (ref >60)
Glucose: 93 mg/dL (ref 70–110)
Potassium: 3.6 mmol/L (ref 3.5–5.3)
Sodium: 140 mmol/L (ref 136–145)

## 2018-03-30 LAB — CBC WITH AUTOMATED DIFF
ABS. BASOPHILS: 0 10*3/uL (ref 0.0–0.1)
ABS. EOSINOPHILS: 0.2 10*3/uL (ref 0.0–0.5)
ABS. IMM. GRANS.: 0 10*3/uL
ABS. LYMPHOCYTES: 1.1 10*3/uL (ref 0.8–3.5)
ABS. MONOCYTES: 0.5 10*3/uL — ABNORMAL LOW (ref 0.8–3.5)
ABS. NEUTROPHILS: 2.4 10*3/uL (ref 1.5–8.0)
BASOPHILS: 1 % (ref 0–2)
EOSINOPHILS: 4 % (ref 0–5)
HCT: 43.2 % (ref 41–53)
HGB: 14.2 g/dL (ref 12.0–16.0)
IMMATURE GRANULOCYTES: 0 % — ABNORMAL LOW (ref 2–10)
LYMPHOCYTES: 26 % (ref 19–48)
MCH: 32.2 PG — ABNORMAL HIGH (ref 27–31)
MCHC: 32.9 g/dL (ref 31–37)
MCV: 98 FL (ref 80–100)
MONOCYTES: 12 % — ABNORMAL HIGH (ref 3–9)
MPV: 10.4 FL — ABNORMAL HIGH (ref 5.9–10.3)
NEUTROPHILS: 57 % (ref 40–74)
PLATELET: 175 10*3/uL (ref 130–400)
RBC: 4.41 M/uL (ref 4.2–5.4)
RDW: 11.9 % (ref 11.5–14.5)
WBC: 4.1 10*3/uL — ABNORMAL LOW (ref 4.5–10.8)

## 2018-03-30 MED ORDER — PREDNISONE 10 MG TAB
10 mg | ORAL | Status: AC
Start: 2018-03-30 — End: 2018-03-30
  Administered 2018-03-30: 19:00:00 via ORAL

## 2018-03-30 MED ORDER — ALBUTEROL SULFATE HFA 90 MCG/ACTUATION AEROSOL INHALER
90 mcg/actuation | RESPIRATORY_TRACT | Status: DC | PRN
Start: 2018-03-30 — End: 2018-03-30
  Administered 2018-03-30: 19:00:00 via RESPIRATORY_TRACT

## 2018-03-30 MED ORDER — PREDNISONE 10 MG TAB
10 mg | ORAL_TABLET | ORAL | 0 refills | Status: AC
Start: 2018-03-30 — End: ?

## 2018-03-30 MED ORDER — INHALATIONAL SPACING DEVICE
Status: AC
Start: 2018-03-30 — End: 2018-03-30
  Administered 2018-03-30: 19:00:00

## 2018-03-30 MED ORDER — GUAIFENESIN 400 MG TAB
400 mg | ORAL_TABLET | ORAL | 0 refills | Status: AC | PRN
Start: 2018-03-30 — End: ?

## 2018-03-30 MED FILL — AEROVENT PLUS SPACER: Qty: 1

## 2018-03-30 MED FILL — PREDNISONE 10 MG TAB: 10 mg | ORAL | Qty: 6

## 2018-03-30 MED FILL — ALBUTEROL SULFATE HFA 90 MCG/ACTUATION AEROSOL INHALER: 90 mcg/actuation | RESPIRATORY_TRACT | Qty: 8.5

## 2018-03-30 NOTE — ED Triage Notes (Signed)
Patient to ER 6 via EMS for c/o syncopal episode while at a store and vomiting.

## 2018-03-30 NOTE — ED Notes (Signed)
Pt resting well, no new complaints at this time, will continue to monitor.

## 2018-03-30 NOTE — ED Provider Notes (Signed)
Pt seen yesterday at [facility] for same and had negative strept and flu. Pt increased cough fatigue and dyspnea. Pt reports pain in the shoulders with deep breathing and burning through the chest with breathing.    The history is provided by the patient.   Shortness of Breath   This is a new problem. The current episode started 2 days ago. The problem has been gradually worsening. Associated symptoms include rhinorrhea, sore throat, cough and sputum production. Pertinent negatives include no fever. She has tried nothing for the symptoms.        Past Medical History:   Diagnosis Date   ??? Syncope        Past Surgical History:   Procedure Laterality Date   ??? HX SALPINGO-OOPHORECTOMY           No family history on file.    Social History     Socioeconomic History   ??? Marital status: SINGLE     Spouse name: Not on file   ??? Number of children: Not on file   ??? Years of education: Not on file   ??? Highest education level: Not on file   Occupational History   ??? Not on file   Social Needs   ??? Financial resource strain: Not on file   ??? Food insecurity:     Worry: Not on file     Inability: Not on file   ??? Transportation needs:     Medical: Not on file     Non-medical: Not on file   Tobacco Use   ??? Smoking status: Never Smoker   ??? Smokeless tobacco: Never Used   Substance and Sexual Activity   ??? Alcohol use: No   ??? Drug use: No   ??? Sexual activity: Not on file   Lifestyle   ??? Physical activity:     Days per week: Not on file     Minutes per session: Not on file   ??? Stress: Not on file   Relationships   ??? Social connections:     Talks on phone: Not on file     Gets together: Not on file     Attends religious service: Not on file     Active member of club or organization: Not on file     Attends meetings of clubs or organizations: Not on file     Relationship status: Not on file   ??? Intimate partner violence:     Fear of current or ex partner: Not on file     Emotionally abused: Not on file     Physically abused: Not on file      Forced sexual activity: Not on file   Other Topics Concern   ??? Not on file   Social History Narrative   ??? Not on file         ALLERGIES: Lortab [hydrocodone-acetaminophen]; Aleve [naproxen sodium]; and Anacin [aspirin-caffeine]    Review of Systems   Constitutional: Positive for chills and fatigue. Negative for diaphoresis and fever.   HENT: Positive for congestion, rhinorrhea, sinus pressure, sinus pain and sore throat.    Eyes: Negative.    Respiratory: Positive for cough, sputum production and shortness of breath.    Cardiovascular: Negative.    Gastrointestinal: Negative.    Genitourinary: Negative.    Musculoskeletal: Positive for myalgias.   Skin: Negative.    Neurological: Negative.    Psychiatric/Behavioral: Negative.    All other systems reviewed and are negative.  Vitals:    03/30/18 1155   BP: 109/70   Pulse: 77   Resp: 16   Temp: 98.4 ??F (36.9 ??C)   SpO2: 100%   Weight: 61.2 kg (135 lb)   Height: 5\' 2"  (1.575 m)            Physical Exam  Vitals signs and nursing note reviewed.   Constitutional:       Appearance: Normal appearance. She is well-developed and well-groomed. She is not ill-appearing, toxic-appearing or diaphoretic.   HENT:      Head: Normocephalic and atraumatic.      Right Ear: External ear normal.      Left Ear: External ear normal.      Nose: Congestion and rhinorrhea present.      Mouth/Throat:      Mouth: Mucous membranes are moist.   Eyes:      Extraocular Movements: Extraocular movements intact.      Conjunctiva/sclera: Conjunctivae normal.      Pupils: Pupils are equal, round, and reactive to light.   Neck:      Musculoskeletal: Full passive range of motion without pain, normal range of motion and neck supple. Normal range of motion. No neck rigidity.   Cardiovascular:      Rate and Rhythm: Normal rate and regular rhythm.      Pulses: Normal pulses.      Heart sounds: Normal heart sounds.   Pulmonary:      Effort: Pulmonary effort is normal.       Breath sounds: Examination of the right-upper field reveals rhonchi. Examination of the left-upper field reveals rhonchi. Examination of the right-middle field reveals rhonchi. Examination of the left-middle field reveals rhonchi. Examination of the right-lower field reveals rhonchi. Examination of the left-lower field reveals rhonchi. Rhonchi present.      Comments: Minimal rhonchi throughout  Musculoskeletal: Normal range of motion.         General: No tenderness.   Skin:     General: Skin is warm and dry.      Capillary Refill: Capillary refill takes less than 2 seconds.   Neurological:      General: No focal deficit present.      Mental Status: She is alert and oriented to person, place, and time. Mental status is at baseline.      GCS: GCS eye subscore is 4. GCS verbal subscore is 5. GCS motor subscore is 6.      Cranial Nerves: Cranial nerves are intact. No cranial nerve deficit.      Sensory: Sensation is intact.      Motor: Motor function is intact.      Coordination: Coordination is intact.   Psychiatric:         Attention and Perception: Attention and perception normal.         Mood and Affect: Affect normal. Mood is anxious.         Speech: Speech normal.         Behavior: Behavior normal. Behavior is cooperative.         Thought Content: Thought content normal.         Cognition and Memory: Cognition and memory normal.          MDM  Number of Diagnoses or Management Options  Coronavirus infection: new and requires workup     Amount and/or Complexity of Data Reviewed  Clinical lab tests: ordered and reviewed  Discussion of test results with the performing providers: yes  Discuss the patient with other providers: yes  Independent visualization of images, tracings, or specimens: yes    Risk of Complications, Morbidity, and/or Mortality  Presenting problems: moderate  Diagnostic procedures: moderate  Management options: moderate           Procedures               CXR nad  LABS Reviewed   Results for orders placed or performed during the hospital encounter of 03/30/18   RESPIRATORY PANEL,PCR,NASOPHARYNGEAL   Result Value Ref Range    Adenovirus NOT DETECTED      Coronavirus 229E NOT DETECTED      Coronavirus HKU1 DETECTED (A)      Coronavirus CVNL63 NOT DETECTED      Coronavirus OC43 NOT DETECTED      Metapneumovirus NOT DETECTED      Rhinovirus and Enterovirus NOT DETECTED      Influenza A NOT DETECTED      Influenza A, subtype H1 NOT DETECTED      Influenza A, subtype H3 NOT DETECTED      INFLUENZA A H1N1 PCR NOT DETECTED      Influenza B NOT DETECTED      Parainfluenza 1 NOT DETECTED      Parainfluenza 2 NOT DETECTED      Parainfluenza 3 NOT DETECTED      Parainfluenza virus 4 NOT DETECTED      RSV by PCR NOT DETECTED      B. parapertussis, PCR NOT DETECTED      Bordetella pertussis - PCR NOT DETECTED      Chlamydophila pneumoniae DNA, QL, PCR NOT DETECTED      Mycoplasma pneumoniae DNA, QL, PCR NOT DETECTED     MAGNESIUM   Result Value Ref Range    Magnesium 2.1 1.8 - 2.4 mg/dL   CBC WITH AUTOMATED DIFF   Result Value Ref Range    WBC 4.1 (L) 4.5 - 10.8 K/uL    RBC 4.41 4.2 - 5.4 M/uL    HGB 14.2 12.0 - 16.0 g/dL    HCT 16.1 41 - 53 %    MCV 98.0 80 - 100 FL    MCH 32.2 (H) 27 - 31 PG    MCHC 32.9 31 - 37 g/dL    RDW 09.6 04.5 - 40.9 %    PLATELET 175 130 - 400 K/uL    MPV 10.4 (H) 5.9 - 10.3 FL    NEUTROPHILS 57 40 - 74 %    LYMPHOCYTES 26 19 - 48 %    MONOCYTES 12 (H) 3 - 9 %    EOSINOPHILS 4 0 - 5 %    BASOPHILS 1 0 - 2 %    ABS. NEUTROPHILS 2.4 1.5 - 8.0 K/UL    ABS. LYMPHOCYTES 1.1 0.8 - 3.5 K/UL    ABS. MONOCYTES 0.5 (L) 0.8 - 3.5 K/UL    ABS. EOSINOPHILS 0.2 0.0 - 0.5 K/UL    ABS. BASOPHILS 0.0 0.0 - 0.1 K/UL    DF AUTOMATED      IMMATURE GRANULOCYTES 0 (L) 2 - 10 %    ABS. IMM. GRANS. 0.0 K/UL   METABOLIC PANEL, BASIC   Result Value Ref Range    Sodium 140 136 - 145 mmol/L    Potassium 3.6 3.5 - 5.3 mmol/L    Chloride 104 98 - 107 mmol/L    CO2 29 21 - 32 mmol/L     Anion gap 7 6 - 15 mmol/L  Glucose 93 70 - 110 mg/dL    BUN 9 7 - 18 MG/DL    Creatinine 6.44 0.34 - 1.30 MG/DL    BUN/Creatinine ratio 13 7 - 25      GFR est AA >60 >60 ml/min/1.47m2    GFR est non-AA >60 >60 ml/min/1.28m2    Calcium 9.2 8.5 - 10.1 MG/DL

## 2018-03-30 NOTE — ED Notes (Signed)
I have reviewed discharge instructions with the patient.  The patient verbalized understanding.

## 2018-03-30 NOTE — ED Provider Notes (Signed)
ED Provider Notes by Marcia Brash, PA at 03/30/18 1203                Author: Marcia Brash, PA  Service: --  Author Type: Physician Assistant       Filed: 03/30/18 1330  Date of Service: 03/30/18 1203  Status: Attested           Editor: Marcia Brash, PA (Physician Assistant)  Cosigner: Jordan Hawks, MD at 03/30/18 1859          Attestation signed by Jordan Hawks, MD at 03/30/18 1859          I was personally available for consultation in the emergency department.  I have reviewed the chart and agree with the documentation recorded by the [facility], including  the assessment, treatment plan, and disposition.   Jordan Hawks, MD                                    Pt seen yesterday at [facility] for same and had negative strept and flu. Pt increased cough fatigue and dyspnea. Pt reports  pain in the shoulders with deep breathing and burning through the chest with breathing.      The history is provided by the patient.    Shortness of Breath    This  is a new problem. The  current episode started 2 days ago. The problem has been gradually worsening.  Associated symptoms include rhinorrhea, sore throat, cough and sputum production.  Pertinent negatives include no fever. She has tried nothing for the symptoms.             Past Medical History:        Diagnosis  Date         ?  Syncope               Past Surgical History:         Procedure  Laterality  Date          ?  HX SALPINGO-OOPHORECTOMY                 No family history on file.        Social History          Socioeconomic History         ?  Marital status:  SINGLE              Spouse name:  Not on file         ?  Number of children:  Not on file     ?  Years of education:  Not on file     ?  Highest education level:  Not on file       Occupational History        ?  Not on file       Social Needs         ?  Financial resource strain:  Not on file        ?  Food insecurity:              Worry:  Not on file         Inability:  Not on file         ?  Transportation needs:              Medical:  Not on file  Non-medical:  Not on file       Tobacco Use         ?  Smoking status:  Never Smoker     ?  Smokeless tobacco:  Never Used       Substance and Sexual Activity         ?  Alcohol use:  No     ?  Drug use:  No     ?  Sexual activity:  Not on file       Lifestyle        ?  Physical activity:              Days per week:  Not on file         Minutes per session:  Not on file         ?  Stress:  Not on file       Relationships        ?  Social connections:              Talks on phone:  Not on file         Gets together:  Not on file         Attends religious service:  Not on file         Active member of club or organization:  Not on file         Attends meetings of clubs or organizations:  Not on file         Relationship status:  Not on file        ?  Intimate partner violence:              Fear of current or ex partner:  Not on file         Emotionally abused:  Not on file         Physically abused:  Not on file         Forced sexual activity:  Not on file        Other Topics  Concern        ?  Not on file       Social History Narrative        ?  Not on file              ALLERGIES: Lortab [hydrocodone-acetaminophen]; Aleve [naproxen sodium]; and Anacin [aspirin-caffeine]      Review of Systems    Constitutional: Positive for chills and fatigue . Negative for diaphoresis and fever.    HENT: Positive for congestion, rhinorrhea , sinus pressure, sinus pain  and sore throat.     Eyes: Negative.     Respiratory: Positive for cough, sputum production  and shortness of breath.     Cardiovascular: Negative.     Gastrointestinal: Negative.     Genitourinary: Negative.     Musculoskeletal: Positive for myalgias.    Skin: Negative.     Neurological: Negative.     Psychiatric/Behavioral: Negative.     All other systems reviewed and are negative.           Vitals:          03/30/18 1155        BP:  109/70     Pulse:  77     Resp:  16     Temp:  98.4 ??F (36.9 ??C)      SpO2:  100%     Weight:  61.2  kg (135 lb)        Height:   (1.575 m)                Physical Exam   Vitals signs and nursing note reviewed.   Constitutional:        Appearance: Normal appearance. She is well-developed and well-groomed. She is not ill-appearing, toxic-appearing or diaphoretic.    HENT:       Head: Normocephalic and atraumatic.      Right Ear: External ear normal.      Left Ear: External ear normal.      Nose: Congestion  and rhinorrhea present.      Mouth/Throat:      Mouth: Mucous membranes are moist.   Eyes:       Extraocular Movements: Extraocular movements intact.      Conjunctiva/sclera: Conjunctivae normal.      Pupils: Pupils are equal, round, and reactive to light.   Neck:       Musculoskeletal: Full passive range of motion without pain, normal range of motion and neck supple. Normal range of motion. No neck rigidity.   Cardiovascular:       Rate and Rhythm: Normal rate and regular rhythm.      Pulses: Normal pulses.      Heart sounds: Normal heart sounds.    Pulmonary:       Effort: Pulmonary effort is normal.      Breath sounds: Examination of the right-upper field reveals rhonchi. Examination of the left-upper  field reveals rhonchi. Examination of the right-middle field reveals rhonchi . Examination of the left-middle field reveals rhonchi. Examination of the right-lower field reveals  rhonchi. Examination of the left-lower field reveals rhonchi.  Rhonchi present.      Comments: Minimal rhonchi throughout   Musculoskeletal: Normal range of motion.          General: No tenderness.    Skin:      General: Skin is warm and dry.      Capillary Refill: Capillary refill takes less than 2 seconds.    Neurological:       General: No focal deficit present.      Mental Status: She is alert and oriented to person, place, and time. Mental status is at baseline.      GCS: GCS eye subscore is 4 . GCS verbal subscore is 5. GCS motor subscore is 6 .      Cranial Nerves: Cranial nerves are intact.  No cranial nerve deficit.      Sensory: Sensation is intact.      Motor: Motor function is intact.      Coordination: Coordination is intact.   Psychiatric:         Attention and Perception: Attention  and perception normal.         Mood and Affect: Affect normal. Mood is anxious.         Speech: Speech normal.         Behavior:  Behavior normal. Behavior is cooperative.         Thought Content: Thought content normal.         Cognition and Memory: Cognition and memory normal.              MDM   Number of Diagnoses or Management Options   Coronavirus infection: new and requires workup       Amount and/or Complexity of Data Reviewed   Clinical lab tests: ordered and reviewed  Discussion of test results with the performing providers: yes    Discuss the patient with other providers: yes   Independent visualization of images, tracings, or specimens: yes      Risk of Complications, Morbidity, and/or Mortality   Presenting problems: moderate  Diagnostic procedures: moderate  Management options: moderate               Procedures                      CXR nad   LABS Reviewed     Results for orders placed or performed during the hospital encounter of 03/30/18     RESPIRATORY PANEL,PCR,NASOPHARYNGEAL         Result  Value  Ref Range            Adenovirus  NOT DETECTED          Coronavirus 229E  NOT DETECTED          Coronavirus HKU1  DETECTED (A)          Coronavirus CVNL63  NOT DETECTED          Coronavirus OC43  NOT DETECTED          Metapneumovirus  NOT DETECTED          Rhinovirus and Enterovirus  NOT DETECTED          Influenza A  NOT DETECTED          Influenza A, subtype H1  NOT DETECTED          Influenza A, subtype H3  NOT DETECTED          INFLUENZA A H1N1 PCR  NOT DETECTED          Influenza B  NOT DETECTED          Parainfluenza 1  NOT DETECTED          Parainfluenza 2  NOT DETECTED          Parainfluenza 3  NOT DETECTED          Parainfluenza virus 4  NOT DETECTED          RSV by PCR  NOT DETECTED          B.  parapertussis, PCR  NOT DETECTED          Bordetella pertussis - PCR  NOT DETECTED          Chlamydophila pneumoniae DNA, QL, PCR  NOT DETECTED          Mycoplasma pneumoniae DNA, QL, PCR  NOT DETECTED          MAGNESIUM         Result  Value  Ref Range            Magnesium  2.1  1.8 - 2.4 mg/dL       CBC WITH AUTOMATED DIFF         Result  Value  Ref Range            WBC  4.1 (L)  4.5 - 10.8 K/uL       RBC  4.41  4.2 - 5.4 M/uL       HGB  14.2  12.0 - 16.0 g/dL       HCT  01.0  41 - 53 %       MCV  98.0  80 - 100 FL       MCH  32.2 (H)  27 -  31 PG       MCHC  32.9  31 - 37 g/dL       RDW  23.3  00.7 - 14.5 %       PLATELET  175  130 - 400 K/uL       MPV  10.4 (H)  5.9 - 10.3 FL       NEUTROPHILS  57  40 - 74 %       LYMPHOCYTES  26  19 - 48 %       MONOCYTES  12 (H)  3 - 9 %       EOSINOPHILS  4  0 - 5 %       BASOPHILS  1  0 - 2 %       ABS. NEUTROPHILS  2.4  1.5 - 8.0 K/UL       ABS. LYMPHOCYTES  1.1  0.8 - 3.5 K/UL       ABS. MONOCYTES  0.5 (L)  0.8 - 3.5 K/UL       ABS. EOSINOPHILS  0.2  0.0 - 0.5 K/UL       ABS. BASOPHILS  0.0  0.0 - 0.1 K/UL            DF  AUTOMATED               IMMATURE GRANULOCYTES  0 (L)  2 - 10 %       ABS. IMM. GRANS.  0.0  K/UL       METABOLIC PANEL, BASIC         Result  Value  Ref Range            Sodium  140  136 - 145 mmol/L       Potassium  3.6  3.5 - 5.3 mmol/L       Chloride  104  98 - 107 mmol/L       CO2  29  21 - 32 mmol/L       Anion gap  7  6 - 15 mmol/L       Glucose  93  70 - 110 mg/dL       BUN  9  7 - 18 MG/DL       Creatinine  6.22  0.60 - 1.30 MG/DL       BUN/Creatinine ratio  13  7 - 25         GFR est AA  >60  >60 ml/min/1.12m2       GFR est non-AA  >60  >60 ml/min/1.83m2            Calcium  9.2  8.5 - 10.1 MG/DL

## 2018-03-30 NOTE — ED Notes (Signed)
Patient to ER 6 via EMS for c/o syncopal episode while at a store and vomiting.

## 2018-10-29 IMAGING — CT CT HEAD CODE STROKE
3 series · 16 of 40 positions shown, 18 images · non-contrast
Comparison: None.

CLINICAL DATA: Code stroke. Initial evaluation for acute slurred
speech.

EXAM:
CT HEAD WITHOUT CONTRAST
TECHNIQUE: Contiguous axial images were obtained from the base of the skull
through the vertex without intravenous contrast.

[Series 3: head 5.0 st · axial · 0.40mm/px · z∈[-626,-506]mm · 7 of 32 slices shown, 9 images]
[im 4/32  brain]
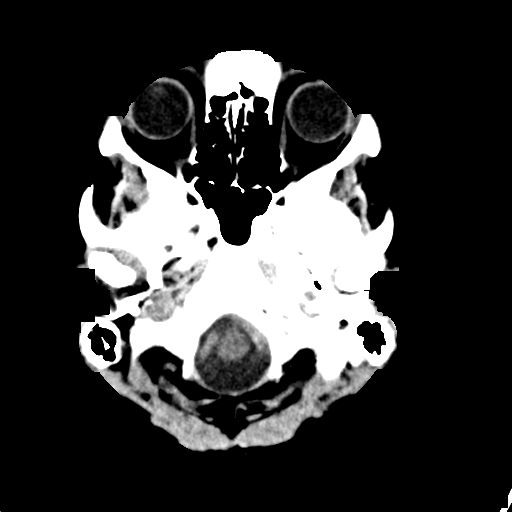
[im 4/32  bone]
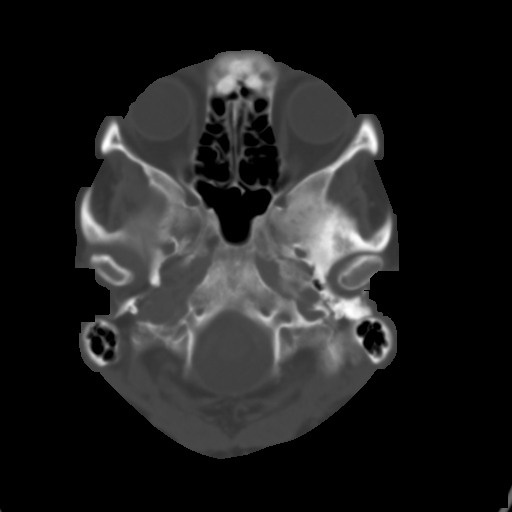
[im 8/32  brain]
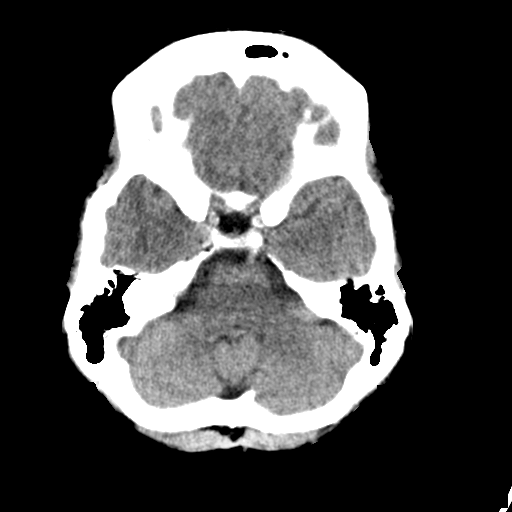
[im 12/32  brain]
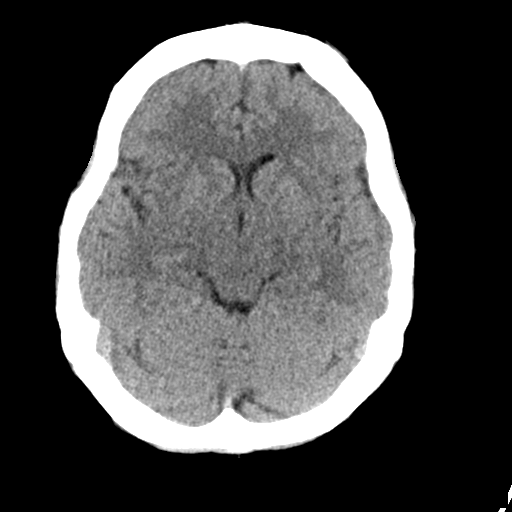
[im 16/32  brain]
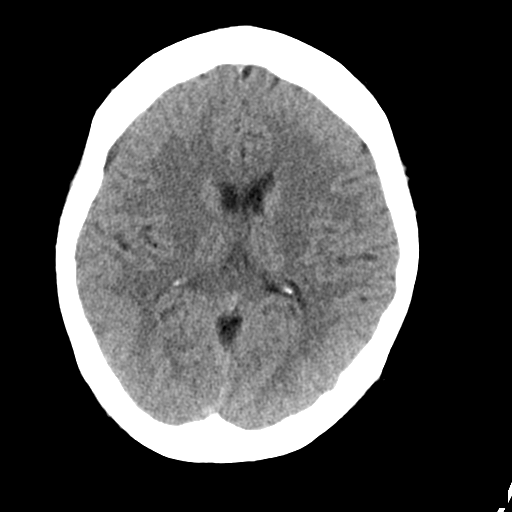
[im 20/32  brain]
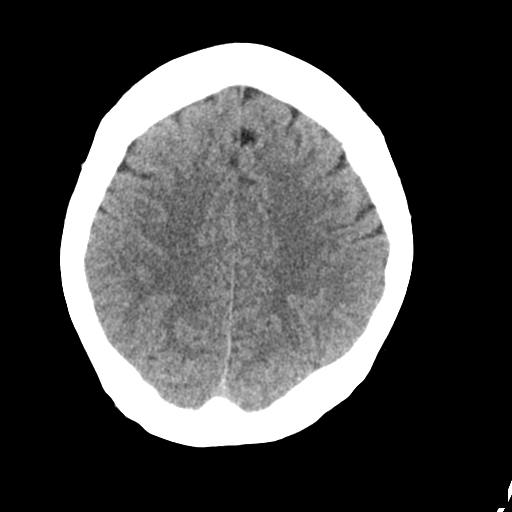
[im 20/32  bone]
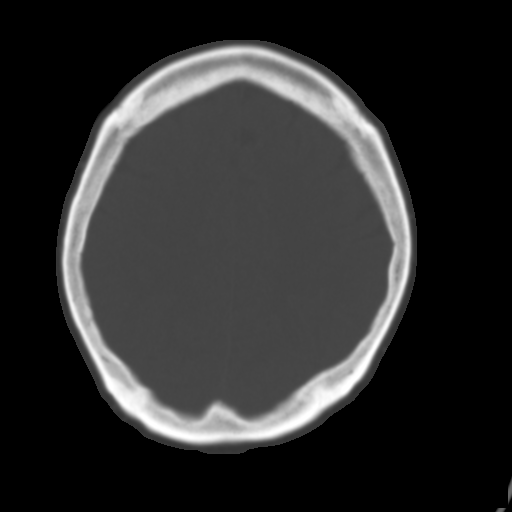
[im 24/32  brain]
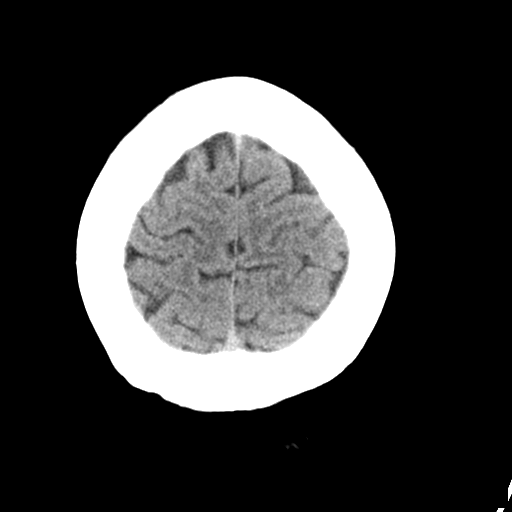
[im 28/32  brain]
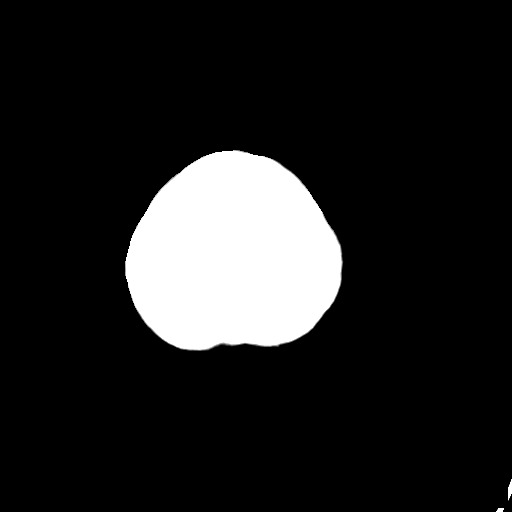

[Series 4: head 2.0 bone · axial · 0.40mm/px · z∈[-627,-531]mm · 6 of 80 slices shown]
[im 8/80  bone]
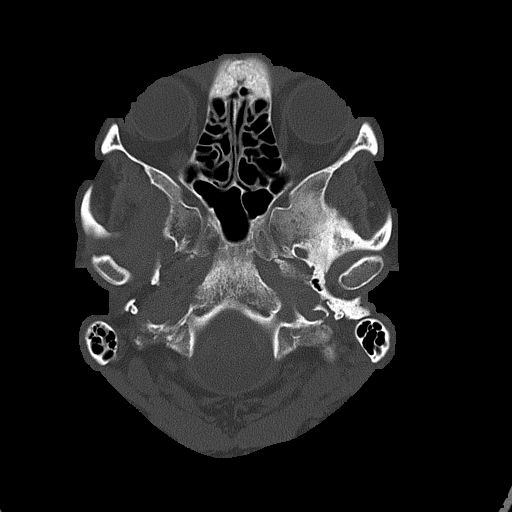
[im 16/80  bone]
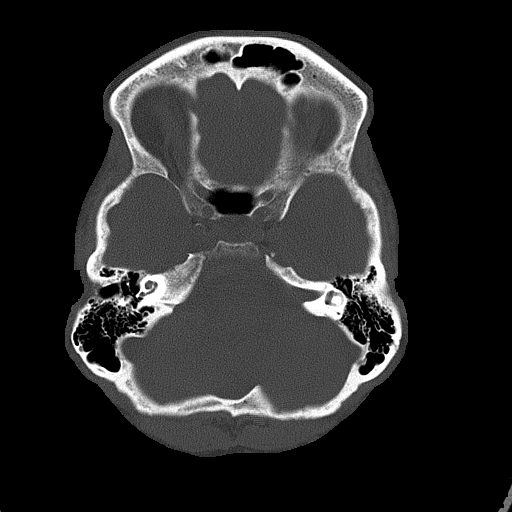
[im 24/80  bone]
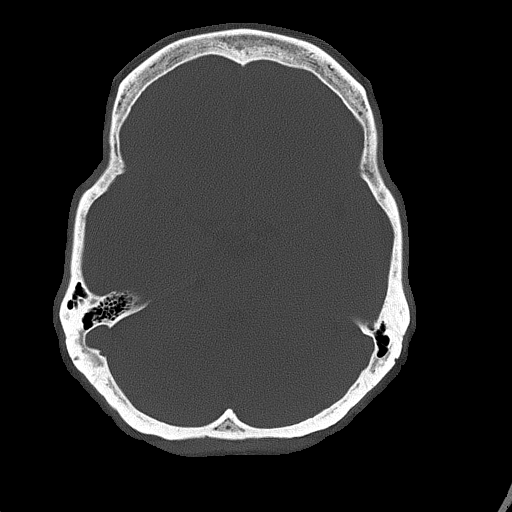
[im 36/80  bone]
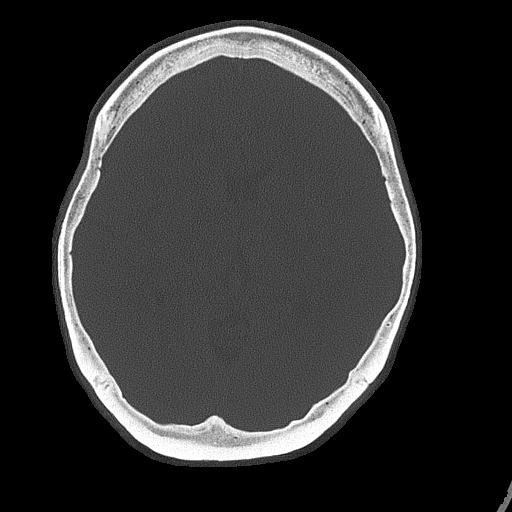
[im 44/80  bone]
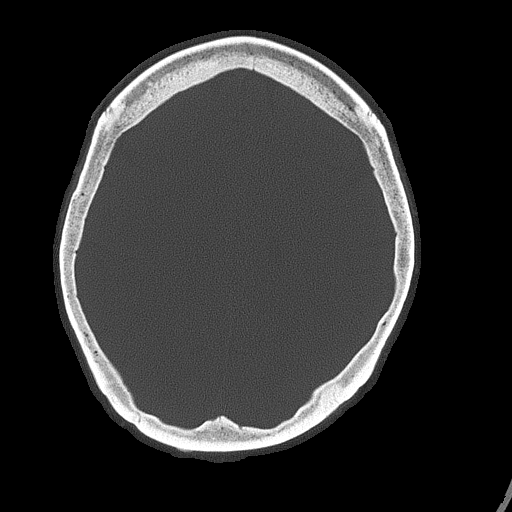
[im 56/80  bone]
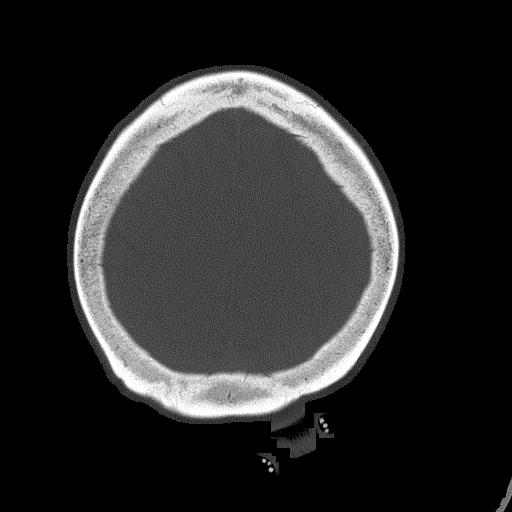

[Series 5: head 3.0 cor st · coronal · 0.31mm/px · 3 of 67 slices shown]
[im 23/67  brain]
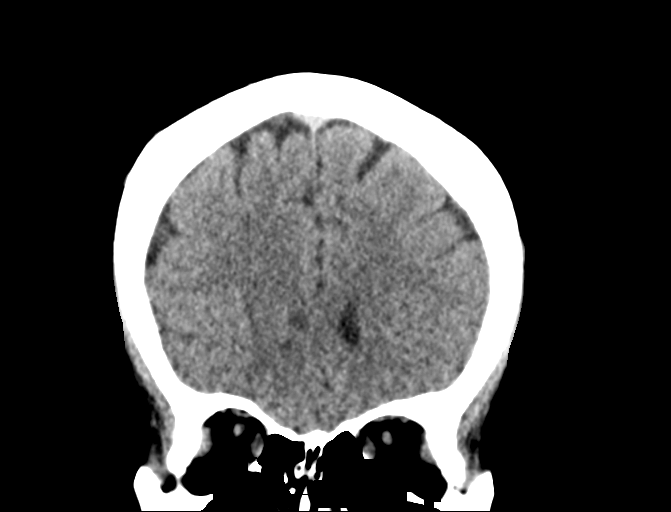
[im 30/67  brain]
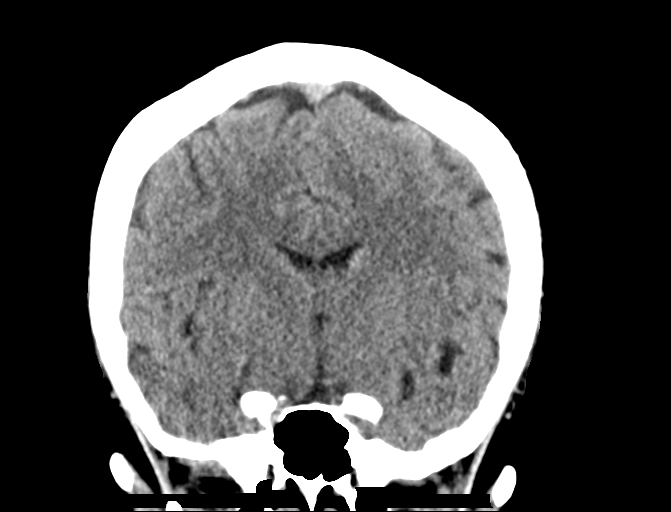
[im 37/67  brain]
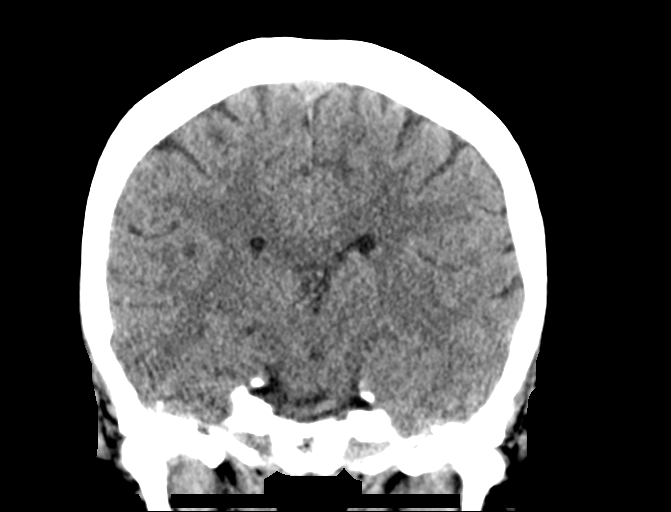

[16 of 40 positions shown; findings below may reference images not displayed]

FINDINGS: Brain: Cerebral volume normal. No acute intracranial hemorrhage. No
evidence for acute large vessel territory infarct. No mass lesion,
midline shift or mass effect. No hydrocephalus. No extra-axial fluid
collection.

Vascular: No asymmetric hyperdense vessel. Scattered vascular
calcifications noted within the carotid siphons.

Skull: Scalp soft tissues and calvarium within normal limits.

Sinuses/Orbits: Globes normal soft tissues normal. Paranasal sinuses
and mastoids are clear.

ASPECTS (Alberta Stroke Program Early CT Score)

- Ganglionic level infarction (caudate, lentiform nuclei, internal
capsule, insula, M1-M3 cortex): 7

- Supraganglionic infarction (M4-M6 cortex): 3

Total score (0-10 with 10 being normal): 10
IMPRESSION: 1. No acute intracranial infarct or other process identified.
2. ASPECTS is 10.
Critical Value/emergent results were called by telephone at the time
of interpretation on 09/15/2016 at [DATE] to Dr. KLEVER JUMPER
, who verbally acknowledged these results.

## 2018-10-29 IMAGING — MR MR HEAD W/O CM
9 of 10 series · 36 of 48 positions shown · non-contrast
Comparison: Prior CT from earlier same day.

CLINICAL DATA: The initial evaluation for acute slurred speech,
headache, visual changes.

EXAM:
MRI HEAD WITHOUT CONTRAST
TECHNIQUE: Multiplanar, multiecho pulse sequences of the brain and surrounding
structures were obtained without intravenous contrast.

[Series 3: DWI · axial · 3.0mm · 1.09mm/px · z∈[-111,+27]mm · 8 of 100 slices shown (1 of 4)]
[im 1/100]
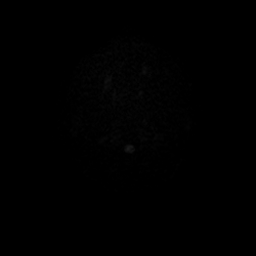
[im 12/100]
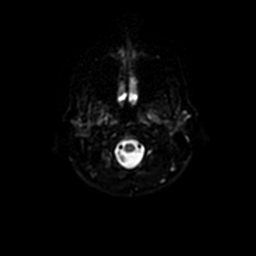
[im 34/100]
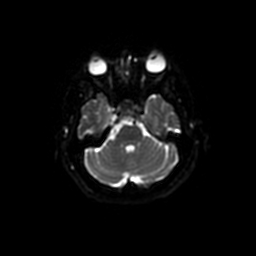
[im 45/100]
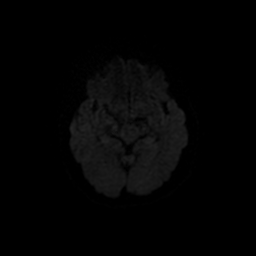
[im 56/100]
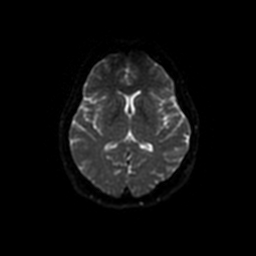
[im 67/100]
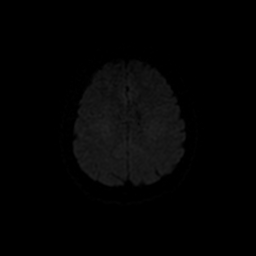
[im 89/100]
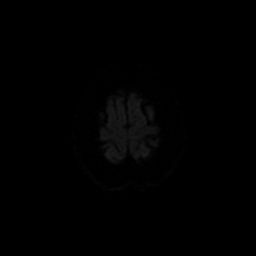
[im 100/100]
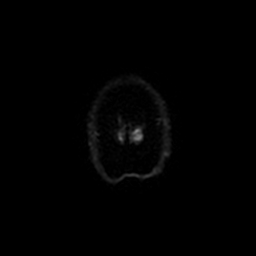

[Series 4: DWI · coronal · 5.0mm · 1.09mm/px · 7 of 70 slices shown (2 of 4)]
[im 1/70]
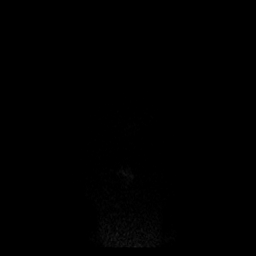
[im 12/70]
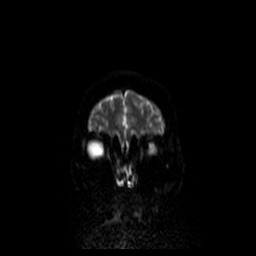
[im 24/70]
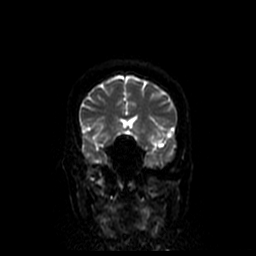
[im 35/70]
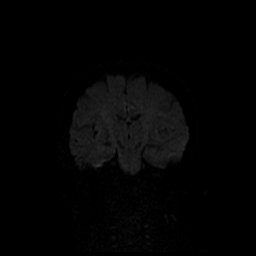
[im 47/70]
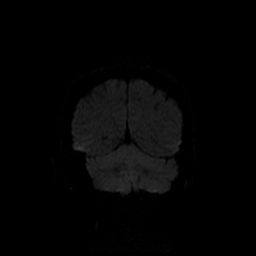
[im 58/70]
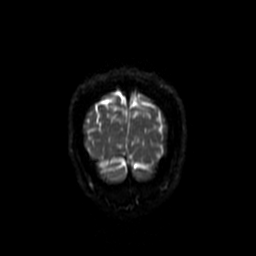
[im 70/70]
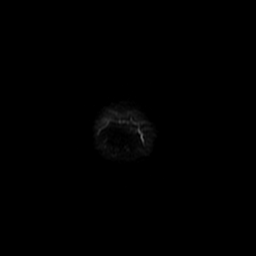

[Series 5: T1 · sagittal · 5.0mm · 0.47mm/px · 2 of 23 slices shown]
[im 1/23]
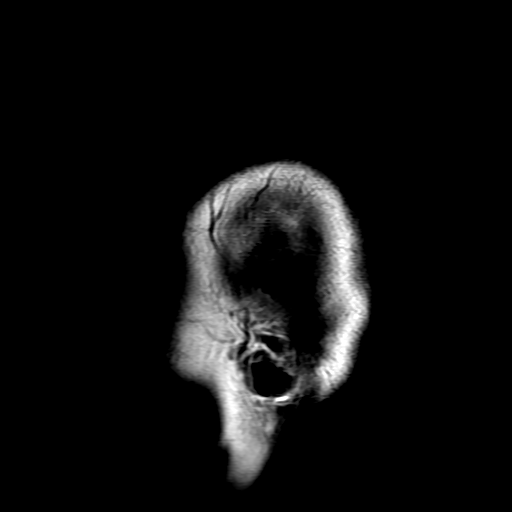
[im 23/23]
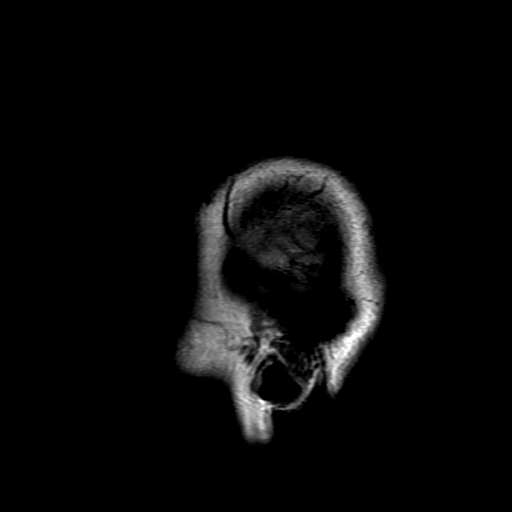

[Series 6: T2 · axial · 5.0mm · 0.43mm/px · z∈[-107,+26]mm · 3 of 25 slices shown (1 of 2)]
[im 1/25]
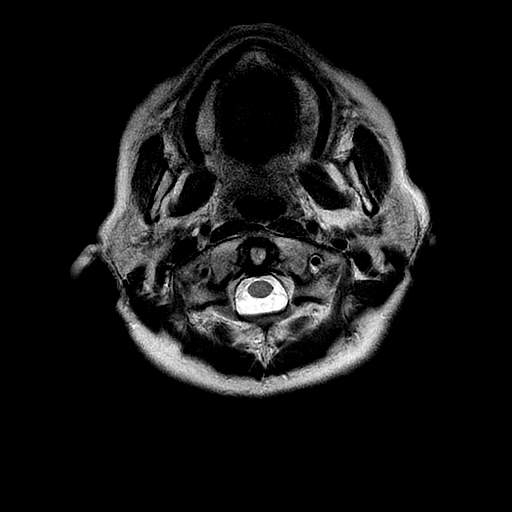
[im 13/25]
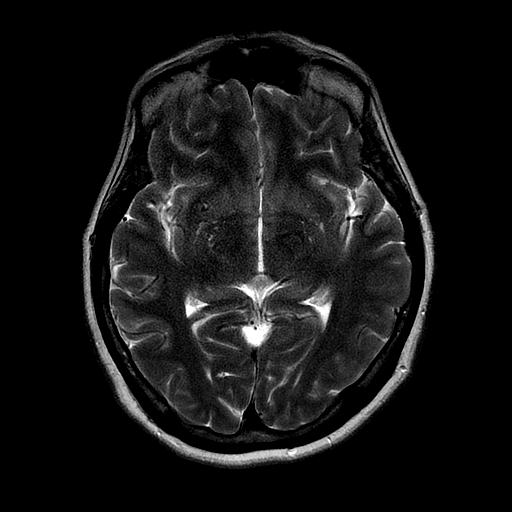
[im 25/25]
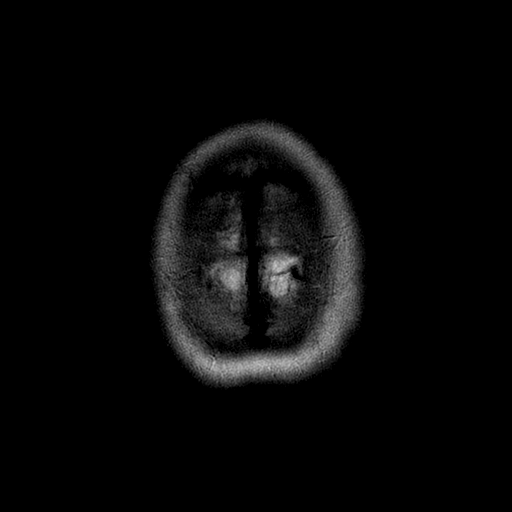

[Series 7: FLAIR · axial · 5.0mm · 0.43mm/px · z∈[-107,+26]mm · 3 of 25 slices shown]
[im 1/25]
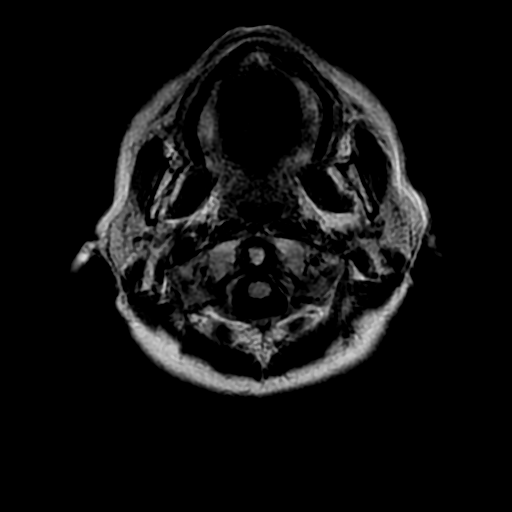
[im 13/25]
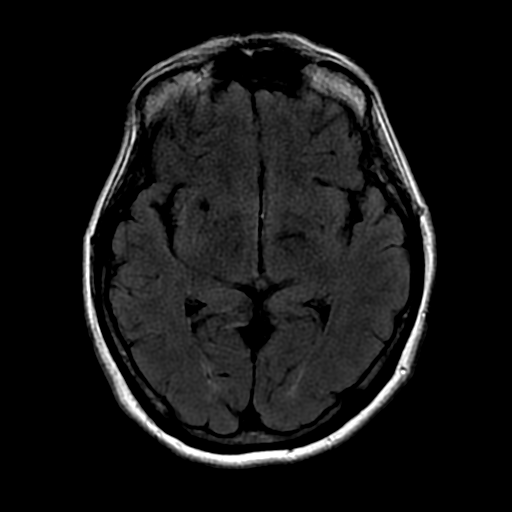
[im 25/25]
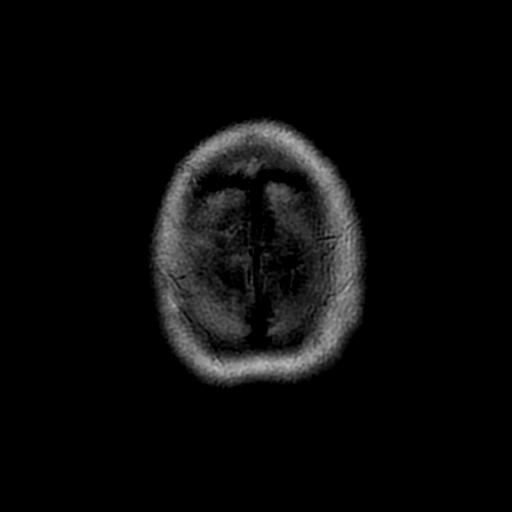

[Series 9: ax mpgr · axial · 5.0mm · 0.47mm/px · z∈[-108,+17]mm · 2 of 20 slices shown]
[im 1/20]
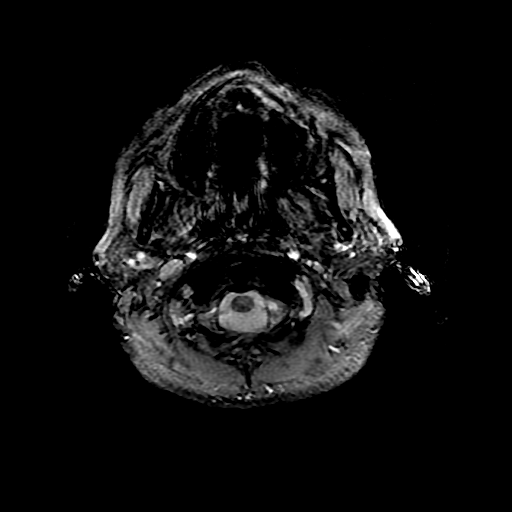
[im 20/20]
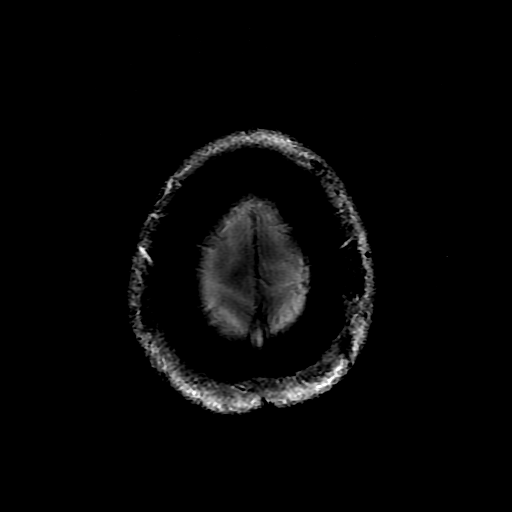

[Series 10: T2 · coronal · 5.0mm · 0.43mm/px · 2 of 24 slices shown (2 of 2)]
[im 1/24]
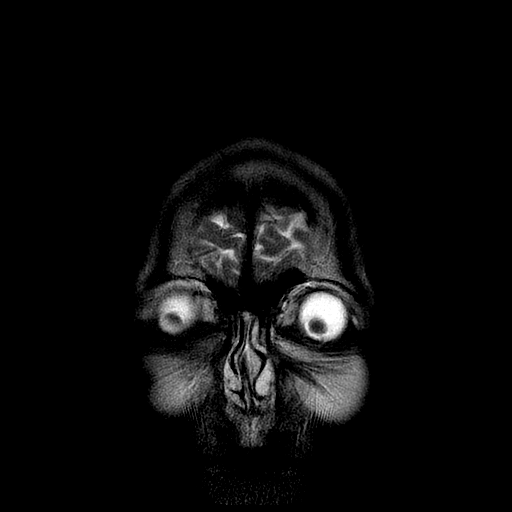
[im 24/24]
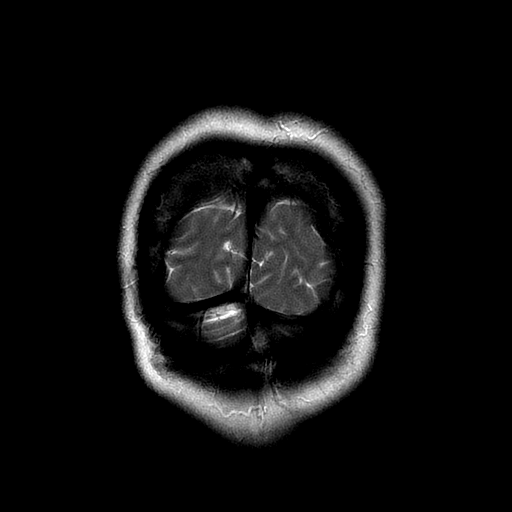

[Series 300: DWI · axial · 3.0mm · 1.09mm/px · z∈[-111,+27]mm · 5 of 50 slices shown (3 of 4)]
[im 1/50]
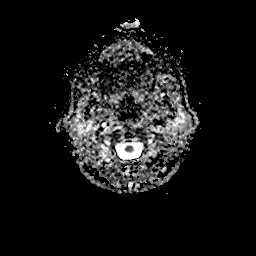
[im 13/50]
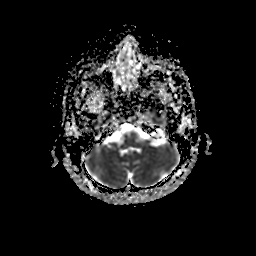
[im 25/50]
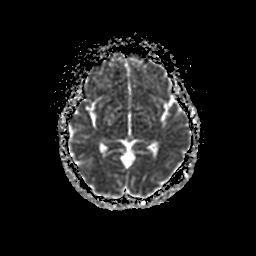
[im 37/50]
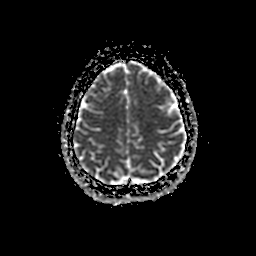
[im 50/50]
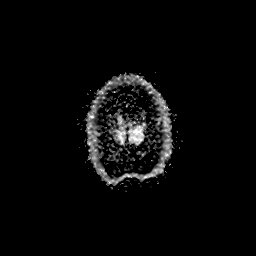

[Series 400: DWI · coronal · 5.0mm · 1.09mm/px · 4 of 35 slices shown (4 of 4)]
[im 1/35]
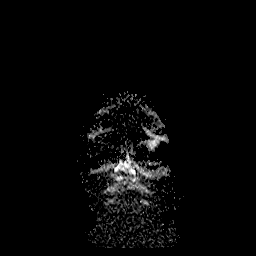
[im 12/35]
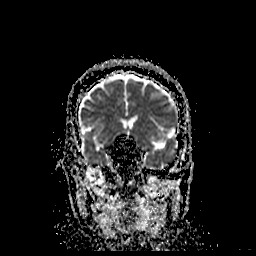
[im 23/35]
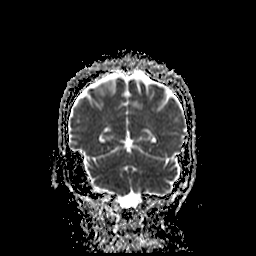
[im 35/35]
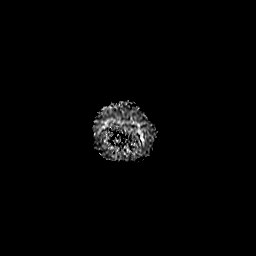

[36 of 48 positions shown; findings below may reference images not displayed]

FINDINGS: Brain: Study mildly degraded by motion artifact.

Cerebral volume within normal limits. No focal parenchymal signal
abnormality. No abnormal foci of restricted diffusion to suggest
acute or subacute ischemia. Gray-white matter differentiation well
maintained. No encephalomalacia to suggest chronic infarction. No
evidence for acute or chronic intracranial hemorrhage.

No mass lesion, midline shift or mass effect. No hydrocephalus. No
extra-axial fluid collection. Major dural sinuses are grossly
patent.

Pituitary gland suprasellar region within normal limits.

Vascular: Major intracranial vascular flow voids are maintained.

Skull and upper cervical spine: Craniocervical junction normal.
Visualized upper cervical spine unremarkable. Bone marrow signal
intensity within normal limits. No scalp soft tissue abnormality.

Sinuses/Orbits: Globes and orbital soft tissues within normal
limits. Paranasal sinuses are clear. No mastoid effusion. Inner ear
structures normal.
IMPRESSION: Normal brain MRI.  No acute intracranial process identified.
